# Patient Record
Sex: Female | Born: 1945 | Race: White | Hispanic: No | Smoking: Former smoker
Health system: Southern US, Community
[De-identification: ages and names within clinical notes are randomized; demographics above are authoritative.]

## PROBLEM LIST (undated history)

## (undated) DIAGNOSIS — I341 Nonrheumatic mitral (valve) prolapse: Secondary | ICD-10-CM

## (undated) DIAGNOSIS — E079 Disorder of thyroid, unspecified: Secondary | ICD-10-CM

## (undated) DIAGNOSIS — H269 Unspecified cataract: Secondary | ICD-10-CM

## (undated) DIAGNOSIS — F32A Depression, unspecified: Secondary | ICD-10-CM

## (undated) DIAGNOSIS — J3800 Paralysis of vocal cords and larynx, unspecified: Secondary | ICD-10-CM

## (undated) DIAGNOSIS — K5790 Diverticulosis of intestine, part unspecified, without perforation or abscess without bleeding: Secondary | ICD-10-CM

## (undated) DIAGNOSIS — S82201A Unspecified fracture of shaft of right tibia, initial encounter for closed fracture: Secondary | ICD-10-CM

## (undated) DIAGNOSIS — R739 Hyperglycemia, unspecified: Secondary | ICD-10-CM

## (undated) DIAGNOSIS — I10 Essential (primary) hypertension: Secondary | ICD-10-CM

## (undated) DIAGNOSIS — H04129 Dry eye syndrome of unspecified lacrimal gland: Secondary | ICD-10-CM

## (undated) DIAGNOSIS — E7800 Pure hypercholesterolemia, unspecified: Secondary | ICD-10-CM

## (undated) DIAGNOSIS — K589 Irritable bowel syndrome without diarrhea: Secondary | ICD-10-CM

## (undated) DIAGNOSIS — F419 Anxiety disorder, unspecified: Secondary | ICD-10-CM

## (undated) DIAGNOSIS — R519 Headache, unspecified: Secondary | ICD-10-CM

## (undated) DIAGNOSIS — F329 Major depressive disorder, single episode, unspecified: Secondary | ICD-10-CM

## (undated) HISTORY — PX: OTHER SURGICAL HISTORY: SHX169

## (undated) HISTORY — DX: Unspecified cataract: H26.9

## (undated) HISTORY — DX: Paralysis of vocal cords and larynx, unspecified: J38.00

## (undated) HISTORY — PX: DILATION AND CURETTAGE OF UTERUS: SHX78

## (undated) HISTORY — DX: Disorder of thyroid, unspecified: E07.9

## (undated) HISTORY — DX: Dry eye syndrome of unspecified lacrimal gland: H04.129

## (undated) HISTORY — DX: Headache, unspecified: R51.9

## (undated) HISTORY — DX: Essential (primary) hypertension: I10

## (undated) HISTORY — DX: Hyperglycemia, unspecified: R73.9

## (undated) HISTORY — DX: Unspecified fracture of shaft of right tibia, initial encounter for closed fracture: S82.201A

## (undated) HISTORY — DX: Diverticulosis of intestine, part unspecified, without perforation or abscess without bleeding: K57.90

## (undated) HISTORY — DX: Pure hypercholesterolemia, unspecified: E78.00

## (undated) HISTORY — PX: CHOLECYSTECTOMY: SHX55

---

## 1967-10-26 HISTORY — PX: BUNIONECTOMY: SHX129

## 1987-10-26 HISTORY — PX: KNEE ARTHROSCOPY: SUR90

## 1998-11-26 ENCOUNTER — Encounter: Payer: Self-pay | Admitting: Specialist

## 1998-12-01 ENCOUNTER — Ambulatory Visit (HOSPITAL_COMMUNITY): Admission: RE | Admit: 1998-12-01 | Discharge: 1998-12-01 | Payer: Self-pay | Admitting: Specialist

## 1999-02-09 ENCOUNTER — Other Ambulatory Visit: Admission: RE | Admit: 1999-02-09 | Discharge: 1999-02-09 | Payer: Self-pay | Admitting: Obstetrics and Gynecology

## 1999-06-22 ENCOUNTER — Ambulatory Visit (HOSPITAL_COMMUNITY): Admission: RE | Admit: 1999-06-22 | Discharge: 1999-06-22 | Payer: Self-pay | Admitting: Gastroenterology

## 2000-04-22 ENCOUNTER — Other Ambulatory Visit: Admission: RE | Admit: 2000-04-22 | Discharge: 2000-04-22 | Payer: Self-pay | Admitting: Obstetrics and Gynecology

## 2001-06-02 ENCOUNTER — Other Ambulatory Visit: Admission: RE | Admit: 2001-06-02 | Discharge: 2001-06-02 | Payer: Self-pay | Admitting: Obstetrics and Gynecology

## 2002-06-15 ENCOUNTER — Other Ambulatory Visit: Admission: RE | Admit: 2002-06-15 | Discharge: 2002-06-15 | Payer: Self-pay | Admitting: Obstetrics and Gynecology

## 2002-07-24 ENCOUNTER — Emergency Department (HOSPITAL_COMMUNITY): Admission: EM | Admit: 2002-07-24 | Discharge: 2002-07-25 | Payer: Self-pay | Admitting: Emergency Medicine

## 2002-07-25 ENCOUNTER — Encounter: Payer: Self-pay | Admitting: *Deleted

## 2003-10-04 ENCOUNTER — Other Ambulatory Visit: Admission: RE | Admit: 2003-10-04 | Discharge: 2003-10-04 | Payer: Self-pay | Admitting: Obstetrics and Gynecology

## 2004-12-25 ENCOUNTER — Other Ambulatory Visit: Admission: RE | Admit: 2004-12-25 | Discharge: 2004-12-25 | Payer: Self-pay | Admitting: Obstetrics and Gynecology

## 2005-03-29 ENCOUNTER — Ambulatory Visit: Admission: RE | Admit: 2005-03-29 | Discharge: 2005-03-29 | Payer: Self-pay | Admitting: Gastroenterology

## 2005-03-29 ENCOUNTER — Encounter (INDEPENDENT_AMBULATORY_CARE_PROVIDER_SITE_OTHER): Payer: Self-pay | Admitting: *Deleted

## 2005-10-16 ENCOUNTER — Emergency Department (HOSPITAL_COMMUNITY): Admission: EM | Admit: 2005-10-16 | Discharge: 2005-10-17 | Payer: Self-pay | Admitting: Emergency Medicine

## 2006-02-18 ENCOUNTER — Other Ambulatory Visit: Admission: RE | Admit: 2006-02-18 | Discharge: 2006-02-18 | Payer: Self-pay | Admitting: Obstetrics and Gynecology

## 2007-03-03 ENCOUNTER — Other Ambulatory Visit: Admission: RE | Admit: 2007-03-03 | Discharge: 2007-03-03 | Payer: Self-pay | Admitting: Obstetrics and Gynecology

## 2007-04-04 ENCOUNTER — Ambulatory Visit (HOSPITAL_COMMUNITY): Admission: RE | Admit: 2007-04-04 | Discharge: 2007-04-04 | Payer: Self-pay | Admitting: Gastroenterology

## 2007-09-28 ENCOUNTER — Encounter: Payer: Self-pay | Admitting: Internal Medicine

## 2007-10-06 ENCOUNTER — Ambulatory Visit: Payer: Self-pay | Admitting: Internal Medicine

## 2007-10-06 DIAGNOSIS — E785 Hyperlipidemia, unspecified: Secondary | ICD-10-CM | POA: Insufficient documentation

## 2007-10-06 DIAGNOSIS — I10 Essential (primary) hypertension: Secondary | ICD-10-CM | POA: Insufficient documentation

## 2007-10-06 DIAGNOSIS — K219 Gastro-esophageal reflux disease without esophagitis: Secondary | ICD-10-CM

## 2007-10-06 LAB — CONVERTED CEMR LAB: Hgb A1c MFr Bld: 5.8 % (ref 4.6–6.0)

## 2007-10-07 ENCOUNTER — Encounter (INDEPENDENT_AMBULATORY_CARE_PROVIDER_SITE_OTHER): Payer: Self-pay | Admitting: *Deleted

## 2007-11-10 ENCOUNTER — Encounter: Payer: Self-pay | Admitting: Internal Medicine

## 2007-12-21 ENCOUNTER — Encounter: Payer: Self-pay | Admitting: Internal Medicine

## 2008-01-01 ENCOUNTER — Encounter (INDEPENDENT_AMBULATORY_CARE_PROVIDER_SITE_OTHER): Payer: Self-pay | Admitting: *Deleted

## 2008-01-01 ENCOUNTER — Ambulatory Visit: Payer: Self-pay | Admitting: Internal Medicine

## 2008-03-08 ENCOUNTER — Other Ambulatory Visit: Admission: RE | Admit: 2008-03-08 | Discharge: 2008-03-08 | Payer: Self-pay | Admitting: Obstetrics and Gynecology

## 2008-05-30 ENCOUNTER — Encounter: Payer: Self-pay | Admitting: Internal Medicine

## 2008-07-11 ENCOUNTER — Encounter: Payer: Self-pay | Admitting: Internal Medicine

## 2008-08-16 ENCOUNTER — Encounter: Payer: Self-pay | Admitting: Internal Medicine

## 2008-12-06 ENCOUNTER — Encounter: Payer: Self-pay | Admitting: Internal Medicine

## 2008-12-12 ENCOUNTER — Encounter: Payer: Self-pay | Admitting: Internal Medicine

## 2009-03-25 HISTORY — PX: HYSTEROSCOPY: SHX211

## 2009-04-03 ENCOUNTER — Encounter: Payer: Self-pay | Admitting: Internal Medicine

## 2009-04-04 ENCOUNTER — Ambulatory Visit (HOSPITAL_COMMUNITY): Admission: RE | Admit: 2009-04-04 | Discharge: 2009-04-04 | Payer: Self-pay | Admitting: Obstetrics & Gynecology

## 2009-04-04 ENCOUNTER — Encounter: Payer: Self-pay | Admitting: Obstetrics & Gynecology

## 2009-05-16 ENCOUNTER — Encounter (INDEPENDENT_AMBULATORY_CARE_PROVIDER_SITE_OTHER): Payer: Self-pay | Admitting: *Deleted

## 2009-08-22 ENCOUNTER — Encounter: Payer: Self-pay | Admitting: Internal Medicine

## 2009-09-26 ENCOUNTER — Ambulatory Visit: Payer: Self-pay | Admitting: Internal Medicine

## 2009-09-26 DIAGNOSIS — R7309 Other abnormal glucose: Secondary | ICD-10-CM

## 2009-09-26 DIAGNOSIS — R6889 Other general symptoms and signs: Secondary | ICD-10-CM | POA: Insufficient documentation

## 2009-09-26 DIAGNOSIS — E559 Vitamin D deficiency, unspecified: Secondary | ICD-10-CM | POA: Insufficient documentation

## 2009-09-28 DIAGNOSIS — E039 Hypothyroidism, unspecified: Secondary | ICD-10-CM

## 2009-09-28 DIAGNOSIS — F411 Generalized anxiety disorder: Secondary | ICD-10-CM | POA: Insufficient documentation

## 2009-10-09 ENCOUNTER — Encounter: Payer: Self-pay | Admitting: Internal Medicine

## 2009-10-10 ENCOUNTER — Ambulatory Visit: Payer: Self-pay | Admitting: Internal Medicine

## 2009-10-12 LAB — CONVERTED CEMR LAB
Calcium: 8.9 mg/dL (ref 8.4–10.5)
Hgb A1c MFr Bld: 6.2 % (ref 4.6–6.5)
Magnesium: 2.1 mg/dL (ref 1.5–2.5)
Vit D, 25-Hydroxy: 21 ng/mL — ABNORMAL LOW (ref 30–89)

## 2009-10-13 ENCOUNTER — Encounter (INDEPENDENT_AMBULATORY_CARE_PROVIDER_SITE_OTHER): Payer: Self-pay | Admitting: *Deleted

## 2009-10-15 ENCOUNTER — Ambulatory Visit: Payer: Self-pay | Admitting: Family

## 2009-10-15 DIAGNOSIS — J040 Acute laryngitis: Secondary | ICD-10-CM | POA: Insufficient documentation

## 2009-11-10 ENCOUNTER — Encounter: Payer: Self-pay | Admitting: Internal Medicine

## 2009-11-21 ENCOUNTER — Ambulatory Visit: Payer: Self-pay | Admitting: Internal Medicine

## 2009-11-21 DIAGNOSIS — IMO0001 Reserved for inherently not codable concepts without codable children: Secondary | ICD-10-CM | POA: Insufficient documentation

## 2009-12-08 ENCOUNTER — Encounter: Payer: Self-pay | Admitting: Internal Medicine

## 2010-05-08 ENCOUNTER — Encounter: Payer: Self-pay | Admitting: Internal Medicine

## 2010-05-11 ENCOUNTER — Encounter: Payer: Self-pay | Admitting: Internal Medicine

## 2010-05-15 ENCOUNTER — Ambulatory Visit: Payer: Self-pay | Admitting: Internal Medicine

## 2010-09-11 ENCOUNTER — Ambulatory Visit: Payer: Self-pay | Admitting: Cardiology

## 2010-09-11 ENCOUNTER — Encounter: Payer: Self-pay | Admitting: Internal Medicine

## 2010-09-11 LAB — LIPID PANEL
Cholesterol: 193 mg/dL (ref 0–200)
Triglycerides: 98 mg/dL (ref 40–160)

## 2010-11-06 ENCOUNTER — Ambulatory Visit
Admission: RE | Admit: 2010-11-06 | Discharge: 2010-11-06 | Payer: Self-pay | Source: Home / Self Care | Attending: Internal Medicine | Admitting: Internal Medicine

## 2010-11-26 NOTE — Assessment & Plan Note (Signed)
Summary: f/u & BP Check - jr   Vital Signs:  Patient profile:   65 year old female Weight:      146.4 pounds Pulse rate:   76 / minute Resp:     15 per minute BP sitting:   140 / 92  (left arm) Cuff size:   large  Vitals Entered By: Shonna Chock (November 21, 2009 3:19 PM) CC: F/U on b/p and ongoing Neck Tightness Comments REVIEWED MED LIST, PATIENT AGREED DOSE AND INSTRUCTION CORRECT    CC:  F/U on b/p and ongoing Neck Tightness.  History of Present Illness: BP control is excellent with Diovan HCT 160/12.5; BP too low with this & Amlodipine  @ even 5 mg once daily.She describes "tightness " @ base of neck bilaterally ; no angioedema.She has no change in neck symptoms with ARB/ HCT. Neck symptoms daily since late October, better overnight, present on weekends also. "Tightness " in neck muscles is  more worrisome than  sensation of throat closing off.No dysphagia with foods or pills.No PMH of asthma. Symptoms no better with H2 blocker qhs  & PPI two times a day since 10/09/2009 as per Dr Madilyn Hook ,ENT , [facility] [place] [facility].  Allergies: 1)  ! Morphine 2)  ! Codeine 3)  ! * Statins (Most) Except Crestor  Review of Systems Eyes:  Denies double vision. ENT:  Denies difficulty swallowing. Resp:  Complains of cough; denies shortness of breath, sputum productive, and wheezing. GI:  Denies indigestion. Neuro:  Denies brief paralysis, numbness, tingling, and weakness.  Physical Exam  General:  in no acute distress; alert,appropriate and cooperative throughout examination Eyes:  No corneal or conjunctival inflammation noted. EOMI. Perrla. Field of  Vision grossly normal. Ears:  External ear exam shows no significant lesions or deformities.  Otoscopic examination reveals clear canals, tympanic membranes are intact bilaterally without bulging, retraction, inflammation or discharge. Hearing is grossly normal bilaterally. Nose:  External nasal examination shows no deformity or  inflammation. Nasal mucosa are pink and moist without lesions or exudates. Mouth:  Oral mucosa and oropharynx without lesions or exudates.  Teeth in good repair. Neck:  No deformities, masses, or tenderness noted. No UAO with hyperventilation. Full ROM Neurologic:  alert & oriented X3, cranial nerves II-XII intact, strength normal in all extremities, sensation intact to light touch, and DTRs symmetrical and normal.   Skin:  Intact without suspicious lesions or rashes Cervical Nodes:  No lymphadenopathy noted Axillary Nodes:  No palpable lymphadenopathy   Impression & Recommendations:  Problem # 1:  MUSCLE PAIN (ICD-729.1)  neck muscle tightness & associated  sensation of throat closure; R/O dystonia  variant Her updated medication list for this problem includes:    Aspirin 81 Mg Tabs (Aspirin) .Marland Kitchen... 1 by mouth once daily  Complete Medication List: 1)  Alprazolam 1 Mg Tabs (Alprazolam) .Marland Kitchen.. 1-2 at bedtime 2)  Bupropion Hcl 300 Mg Xr24h-tab (Bupropion hcl) .Marland Kitchen.. 1 by mouth once daily 3)  Klor-con M20 20 Meq Cr-tabs (Potassium chloride crys cr) .Marland Kitchen.. 1 by mouth once daily 4)  Miralax Powd (Polyethylene glycol 3350) .Marland Kitchen.. 1-17gm at bedtime 5)  Nulev 0.125 Mg Tbdp (Hyoscyamine sulfate) .Marland Kitchen.. 1 by mouth as needed 6)  Provigil 200 Mg Tabs (Modafinil) .Marland Kitchen.. 1 by mouth once daily 7)  Synthroid 75 Mcg Tabs (Levothyroxine sodium) .Marland Kitchen.. 1 by mouth once daily 8)  Zetia 10 Mg Tabs (Ezetimibe) .Marland Kitchen.. 1 by mouth once daily 9)  Aspirin 81 Mg Tabs (Aspirin) .Marland KitchenMarland KitchenMarland Kitchen  1 by mouth once daily 10)  Crestor 5 Mg Tabs (Rosuvastatin calcium) .Marland Kitchen.. 1 by mouth at bedtime 11)  Glycopyrrolate 2 Mg Tabs (Glycopyrrolate) .... 1/2-1 by mouth once daily 12)  Lamotrigine 100 Mg Tabs (Lamotrigine) .Marland Kitchen.. 1 by mouth am, 1 by mouth at bedtime 13)  Nexium 40 Mg Cpdr (Esomeprazole magnesium) .... Take one tablet two times a day 14)  Prometrium 100 Mg Caps (Progesterone micronized) .Marland Kitchen.. 1 by mouth at bedtime 15)  Ranitidine Hcl 300 Mg Caps  (Ranitidine hcl) .... Take one tablet daily 16)  Vivelle-dot 0.05 Mg/24hr Pttw (Estradiol) .Marland Kitchen.. 1 by mouth twice weekly 17)  Diovan Hct 160-12.5 Mg Tabs (Valsartan-hydrochlorothiazide) .Marland Kitchen.. 1 by mouth once daily  Patient Instructions: 1)  Please see Dr Madilyn Hook to F/U. Correct or add to this record prior to that appt as needed

## 2010-11-26 NOTE — Letter (Signed)
Summary: [facility]  [facility]   Imported By: Lanelle Bal 12/17/2009 09:42:16  _____________________________________________________________________  External Attachment:    Type:   Image     Comment:   External Document

## 2010-11-26 NOTE — Letter (Signed)
Summary: Email from Patient Regarding BP & Meds  Email from Patient Regarding BP & Meds   Imported By: Lanelle Bal 11/17/2009 09:36:48  _____________________________________________________________________  External Attachment:    Type:   Image     Comment:   External Document

## 2010-11-26 NOTE — Letter (Signed)
Summary: [facility]  [facility]   Imported By: Lanelle Bal 11/11/2009 09:45:23  _____________________________________________________________________  External Attachment:    Type:   Image     Comment:   External Document

## 2010-11-26 NOTE — Assessment & Plan Note (Signed)
Summary: f/u blood sugar/cbs   Vital Signs:  Patient profile:   65 year old female Weight:      138.4 pounds BMI:     25.61 Pulse rate:   72 / minute Resp:     13 per minute BP sitting:   132 / 84  (left arm) Cuff size:   large  Vitals Entered By: Shonna Chock CMA (November 06, 2010 8:07 AM) CC: Follow-up visit: discuss bloodsugars, Type 2 diabetes mellitus follow-up   CC:  Follow-up visit: discuss bloodsugars and Type 2 diabetes mellitus follow-up.  History of Present Illness:      This is a 65 year old woman who presents for  fasting hyperglycemia  follow-up.  The patient reports weight loss of 10#, but denies polyuria, polydipsia, blurred vision, self managed hypoglycemia, and numbness of extremities.  The patient denies the following symptoms: neuropathic pain, chest pain, vomiting, orthostatic symptoms, poor wound healing, vision loss, and foot ulcer.  Since the last visit the patient reports good dietary compliance and not monitoring blood glucose.  Kaysa  reports having had eye care by an ophthalmologist;no retinopathy.A1c 6.2%(132/24% risk); LDL is @ goal (99).     Current Medications (verified): 1)  Alprazolam 1 Mg Tabs (Alprazolam) .Marland Kitchen.. 1-2 At Bedtime 2)  Bupropion Hcl 300 Mg Xr24h-Tab (Bupropion Hcl) .Marland Kitchen.. 1 By Mouth Once Daily 3)  Miralax  Powd (Polyethylene Glycol 3350) .Marland Kitchen.. 1-17gm At Bedtime 4)  Synthroid 75 Mcg Tabs (Levothyroxine Sodium) .Marland Kitchen.. 1 By Mouth Once Daily 5)  Zetia 10 Mg Tabs (Ezetimibe) .Marland Kitchen.. 1 By Mouth Once Daily 6)  Aspirin 81 Mg Tabs (Aspirin) .Marland Kitchen.. 1 By Mouth Once Daily 7)  Crestor 5 Mg Tabs (Rosuvastatin Calcium) .Marland Kitchen.. 1 By Mouth At Bedtime 8)  Glycopyrrolate 2 Mg Tabs (Glycopyrrolate) .... 1/2-1 By Mouth Once Daily 9)  Lamotrigine 100 Mg Tabs (Lamotrigine) .Marland Kitchen.. 1 By Mouth Am, 1 By Mouth At Bedtime 10)  Nexium 40 Mg Cpdr (Esomeprazole Magnesium) .Marland Kitchen.. 1 By Mouth Once Daily 11)  Ranitidine Hcl 300 Mg Caps (Ranitidine Hcl) .... Take One Tablet Daily 12)   Diovan Hct 160-12.5 Mg Tabs (Valsartan-Hydrochlorothiazide) .Marland Kitchen.. 1 By Mouth Once Daily  Allergies: 1)  ! Morphine 2)  ! Codeine 3)  ! * Statins (Most) Except Crestor  Physical Exam  General:  well-nourished;alert,appropriate and cooperative throughout examination Lungs:  Normal respiratory effort, chest expands symmetrically. Lungs are clear to auscultation, no crackles or wheezes. Heart:  Normal rate and regular rhythm. S1 and S2 normal without gallop, murmur, click, rub or other extra sounds. Pulses:  R and L carotid,radial,dorsalis pedis and posterior tibial pulses are full and equal bilaterally Extremities:  No clubbing, cyanosis, edema. 2nd R toe overlaps great toe Neurologic:  alert & oriented X3 and sensation intact to light touch over feet.   Skin:  Intact without suspicious lesions or rashes Psych:  Focused & intelligent   Impression & Recommendations:  Problem # 1:  HYPERGLYCEMIA, FASTING (ICD-790.29)  Complete Medication List: 1)  Alprazolam 1 Mg Tabs (Alprazolam) .Marland Kitchen.. 1-2 at bedtime 2)  Bupropion Hcl 300 Mg Xr24h-tab (Bupropion hcl) .Marland Kitchen.. 1 by mouth once daily 3)  Miralax Powd (Polyethylene glycol 3350) .Marland Kitchen.. 1-17gm at bedtime 4)  Synthroid 75 Mcg Tabs (Levothyroxine sodium) .Marland Kitchen.. 1 by mouth once daily 5)  Zetia 10 Mg Tabs (Ezetimibe) .Marland Kitchen.. 1 by mouth once daily 6)  Aspirin 81 Mg Tabs (Aspirin) .Marland Kitchen.. 1 by mouth once daily 7)  Crestor 5 Mg Tabs (Rosuvastatin calcium) .Marland Kitchen.. 1 by  mouth at bedtime 8)  Glycopyrrolate 2 Mg Tabs (Glycopyrrolate) .... 1/2-1 by mouth once daily 9)  Lamotrigine 100 Mg Tabs (Lamotrigine) .Marland Kitchen.. 1 by mouth am, 1 by mouth at bedtime 10)  Nexium 40 Mg Cpdr (Esomeprazole magnesium) .Marland Kitchen.. 1 by mouth once daily 11)  Ranitidine Hcl 300 Mg Caps (Ranitidine hcl) .... Take one tablet daily 12)  Diovan Hct 160-12.5 Mg Tabs (Valsartan-hydrochlorothiazide) .Marland Kitchen.. 1 by mouth once daily  Patient Instructions: 1)  Avoid HFCS sugar as discussed. Consider low carb diet.    2)  Please  have  a follow-up   A1c  in 6 months. 3)  Check your feet each night for sore areas, calluses or signs of infection. Consider seeing Dr Lestine Box. 4)  It is important that you exercise regularly at least 20 minutes 5 times a week. If you develop chest pain, have severe difficulty breathing, or feel very tired , stop exercising immediately and seek medical attention.   Orders Added: 1)  Est. Patient Level III [[address]]

## 2010-11-26 NOTE — Medication Information (Signed)
Summary: Possible Nonadherence with Amlodipine Besylate/[facility]  Possible Nonadherence with Amlodipine Besylate/[facility]   Imported By: Lanelle Bal 12/13/2009 10:32:09  _____________________________________________________________________  External Attachment:    Type:   Image     Comment:   External Document

## 2010-11-26 NOTE — Assessment & Plan Note (Signed)
Summary: follow-up//lch   Vital Signs:  Patient profile:   65 year old female Weight:      143.6 pounds Pulse rate:   76 / minute Resp:     15 per minute BP sitting:   134 / 80  (left arm) Cuff size:   large  Vitals Entered By: Shonna Chock CMA (May 15, 2010 9:11 AM) CC: DM Follow-up, Type 2 diabetes mellitus follow-up   CC:  DM Follow-up and Type 2 diabetes mellitus follow-up.  History of Present Illness:  Type 2 Diabetes Mellitus Risk Assessment      This is a 65 year old woman who presents for Type 2 diabetes mellitus  risk monitor . FBS @ Dr Yevonne Pax office 124; A1c 6% 05/11/2010.  The patient reports self managed hypoglycemia  approx 1X/month in afternoon when lunch skipped, but denies polyuria, polydipsia, blurred vision, weight loss, weight gain, and numbness of extremities.  The patient denies the following symptoms: neuropathic pain, chest pain, vomiting, orthostatic symptoms, poor wound healing, intermittent claudication, vision loss, and foot ulcer.  Since the last visit the patient reports poor dietary compliance ( eating carbs when stressed), not exercising regularly. No glucose  monitoring . Eye care by  Dr Burgess Estelle, Ophthalmologist  this afternoon.No foot care to date.  No FH DM.  Dr Tomi Bamberger labs reviewed ; all others @ goal.(scanned)  Current Medications (verified): 1)  Alprazolam 1 Mg Tabs (Alprazolam) .Marland Kitchen.. 1-2 At Bedtime 2)  Bupropion Hcl 300 Mg Xr24h-Tab (Bupropion Hcl) .Marland Kitchen.. 1 By Mouth Once Daily 3)  Klor-Con M20 20 Meq Cr-Tabs (Potassium Chloride Crys Cr) .Marland Kitchen.. 1 By Mouth Once Daily 4)  Miralax  Powd (Polyethylene Glycol 3350) .Marland Kitchen.. 1-17gm At Bedtime 5)  Nulev 0.125 Mg Tbdp (Hyoscyamine Sulfate) .Marland Kitchen.. 1 By Mouth As Needed 6)  Provigil 200 Mg Tabs (Modafinil) .Marland Kitchen.. 1 By Mouth Once Daily 7)  Synthroid 75 Mcg Tabs (Levothyroxine Sodium) .Marland Kitchen.. 1 By Mouth Once Daily 8)  Zetia 10 Mg Tabs (Ezetimibe) .Marland Kitchen.. 1 By Mouth Once Daily 9)  Aspirin 81 Mg Tabs (Aspirin) .Marland Kitchen.. 1 By  Mouth Once Daily 10)  Crestor 5 Mg Tabs (Rosuvastatin Calcium) .Marland Kitchen.. 1 By Mouth At Bedtime 11)  Glycopyrrolate 2 Mg Tabs (Glycopyrrolate) .... 1/2-1 By Mouth Once Daily 12)  Lamotrigine 100 Mg Tabs (Lamotrigine) .Marland Kitchen.. 1 By Mouth Am, 1 By Mouth At Bedtime 13)  Nexium 40 Mg Cpdr (Esomeprazole Magnesium) .Marland Kitchen.. 1 By Mouth Once Daily 14)  Prometrium 100 Mg Caps (Progesterone Micronized) .Marland Kitchen.. 1 By Mouth At Bedtime 15)  Ranitidine Hcl 300 Mg Caps (Ranitidine Hcl) .... Take One Tablet Daily 16)  Vivelle-Dot 0.05 Mg/24hr Pttw (Estradiol) .Marland Kitchen.. 1 By Mouth Twice Weekly 17)  Diovan Hct 160-12.5 Mg Tabs (Valsartan-Hydrochlorothiazide) .Marland Kitchen.. 1 By Mouth Once Daily  Allergies: 1)  ! Morphine 2)  ! Codeine 3)  ! * Statins (Most) Except Crestor  Physical Exam  General:  well-nourished; alert,appropriate and cooperative throughout examination Neck:  No deformities, masses, or tenderness noted. Lungs:  Normal respiratory effort, chest expands symmetrically. Lungs are clear to auscultation, no crackles or wheezes. Heart:  Normal rate and regular rhythm. S1 and S2 normal without gallop, murmur, click, rub . S4 with slight slurring Abdomen:  Bowel sounds positive,abdomen soft and non-tender without masses, organomegaly or hernias noted. Pulses:  R and L carotid,radial,dorsalis pedis and posterior tibial pulses are full and equal bilaterally Extremities:  No clubbing, cyanosis, edema. 2nd R toe overlaps  great toe.  Deformed L great toe nail Neurologic:  alert & oriented X3 and sensation intact to light touch over feet.   Skin:  Intact without suspicious lesions or rashes Psych:  memory intact for recent and remote, normally interactive, and good eye contact.     Impression & Recommendations:  Problem # 1:  HYPERGLYCEMIA, FASTING (ICD-790.29)  Problem # 2:  HYPOTHYROIDISM (ICD-244.9) TSH therapeutic Her updated medication list for this problem includes:    Synthroid 75 Mcg Tabs (Levothyroxine sodium) .Marland Kitchen... 1  by mouth once daily  Complete Medication List: 1)  Alprazolam 1 Mg Tabs (Alprazolam) .Marland Kitchen.. 1-2 at bedtime 2)  Bupropion Hcl 300 Mg Xr24h-tab (Bupropion hcl) .Marland Kitchen.. 1 by mouth once daily 3)  Klor-con M20 20 Meq Cr-tabs (Potassium chloride crys cr) .Marland Kitchen.. 1 by mouth once daily 4)  Miralax Powd (Polyethylene glycol 3350) .Marland Kitchen.. 1-17gm at bedtime 5)  Nulev 0.125 Mg Tbdp (Hyoscyamine sulfate) .Marland Kitchen.. 1 by mouth as needed 6)  Provigil 200 Mg Tabs (Modafinil) .Marland Kitchen.. 1 by mouth once daily 7)  Synthroid 75 Mcg Tabs (Levothyroxine sodium) .Marland Kitchen.. 1 by mouth once daily 8)  Zetia 10 Mg Tabs (Ezetimibe) .Marland Kitchen.. 1 by mouth once daily 9)  Aspirin 81 Mg Tabs (Aspirin) .Marland Kitchen.. 1 by mouth once daily 10)  Crestor 5 Mg Tabs (Rosuvastatin calcium) .Marland Kitchen.. 1 by mouth at bedtime 11)  Glycopyrrolate 2 Mg Tabs (Glycopyrrolate) .... 1/2-1 by mouth once daily 12)  Lamotrigine 100 Mg Tabs (Lamotrigine) .Marland Kitchen.. 1 by mouth am, 1 by mouth at bedtime 13)  Nexium 40 Mg Cpdr (Esomeprazole magnesium) .Marland Kitchen.. 1 by mouth once daily 14)  Prometrium 100 Mg Caps (Progesterone micronized) .Marland Kitchen.. 1 by mouth at bedtime 15)  Ranitidine Hcl 300 Mg Caps (Ranitidine hcl) .... Take one tablet daily 16)  Vivelle-dot 0.05 Mg/24hr Pttw (Estradiol) .Marland Kitchen.. 1 by mouth twice weekly 17)  Diovan Hct 160-12.5 Mg Tabs (Valsartan-hydrochlorothiazide) .Marland Kitchen.. 1 by mouth once daily  Patient Instructions: 1)  Consume <  30 grams of sugar/ day from foods & drinks with High Fructose Corn Syrup  as #1, 2 or # 3 on label.Please  have  an A1c in 6 months. (Code : 790.29).Do not  skip lunch.It is important that you exercise regularly at least 20 minutes 5 times a week. If you develop chest pain, have severe difficulty breathing, or feel very tired , stop exercising immediately and seek medical attention. 2)  Take an  81 mg coated Aspirin every day.Check your feet each night for sore areas, calluses or signs of infection. Call if you change your mind about seeing a Podiatrist.

## 2010-11-27 ENCOUNTER — Encounter: Payer: Self-pay | Admitting: Cardiology

## 2010-11-27 DIAGNOSIS — R739 Hyperglycemia, unspecified: Secondary | ICD-10-CM | POA: Insufficient documentation

## 2010-11-27 DIAGNOSIS — E7800 Pure hypercholesterolemia, unspecified: Secondary | ICD-10-CM | POA: Insufficient documentation

## 2010-11-27 DIAGNOSIS — I10 Essential (primary) hypertension: Secondary | ICD-10-CM | POA: Insufficient documentation

## 2011-01-15 ENCOUNTER — Ambulatory Visit: Payer: Self-pay | Admitting: Cardiology

## 2011-02-01 LAB — CBC
HCT: 41.6 % (ref 36.0–46.0)
MCV: 95.6 fL (ref 78.0–100.0)
Platelets: 227 10*3/uL (ref 150–400)
RDW: 13 % (ref 11.5–15.5)
WBC: 6.3 10*3/uL (ref 4.0–10.5)

## 2011-02-01 LAB — URINALYSIS, MICROSCOPIC ONLY
Glucose, UA: NEGATIVE mg/dL
Ketones, ur: NEGATIVE mg/dL
Leukocytes, UA: NEGATIVE
Protein, ur: NEGATIVE mg/dL

## 2011-02-01 LAB — BASIC METABOLIC PANEL
BUN: 8 mg/dL (ref 6–23)
Chloride: 100 mEq/L (ref 96–112)
Creatinine, Ser: 0.61 mg/dL (ref 0.4–1.2)

## 2011-02-15 ENCOUNTER — Other Ambulatory Visit: Payer: Self-pay | Admitting: *Deleted

## 2011-02-15 DIAGNOSIS — E7800 Pure hypercholesterolemia, unspecified: Secondary | ICD-10-CM

## 2011-02-26 ENCOUNTER — Ambulatory Visit (INDEPENDENT_AMBULATORY_CARE_PROVIDER_SITE_OTHER): Payer: Medicare Other | Admitting: Cardiology

## 2011-02-26 ENCOUNTER — Other Ambulatory Visit (INDEPENDENT_AMBULATORY_CARE_PROVIDER_SITE_OTHER): Payer: Medicare Other | Admitting: *Deleted

## 2011-02-26 ENCOUNTER — Encounter: Payer: Self-pay | Admitting: Cardiology

## 2011-02-26 DIAGNOSIS — R7309 Other abnormal glucose: Secondary | ICD-10-CM

## 2011-02-26 DIAGNOSIS — E785 Hyperlipidemia, unspecified: Secondary | ICD-10-CM

## 2011-02-26 DIAGNOSIS — F411 Generalized anxiety disorder: Secondary | ICD-10-CM

## 2011-02-26 DIAGNOSIS — E7800 Pure hypercholesterolemia, unspecified: Secondary | ICD-10-CM

## 2011-02-26 DIAGNOSIS — R739 Hyperglycemia, unspecified: Secondary | ICD-10-CM

## 2011-02-26 DIAGNOSIS — I10 Essential (primary) hypertension: Secondary | ICD-10-CM

## 2011-02-26 LAB — LIPID PANEL
HDL: 63.8 mg/dL (ref >39.00)
LDL Cholesterol: 90 mg/dL (ref 0–99)
Total CHOL/HDL Ratio: 3
VLDL: 13 mg/dL (ref 0.0–40.0)

## 2011-02-26 LAB — HEPATIC FUNCTION PANEL
Alkaline Phosphatase: 54 U/L (ref 39–117)
Bilirubin, Direct: 0.1 mg/dL (ref 0.0–0.3)
Total Bilirubin: 0.3 mg/dL (ref 0.3–1.2)

## 2011-02-26 LAB — BASIC METABOLIC PANEL
CO2: 30 mEq/L (ref 19–32)
Calcium: 9.4 mg/dL (ref 8.4–10.5)
GFR: 87.61 mL/min (ref >60.00)
Sodium: 140 mEq/L (ref 135–145)

## 2011-02-26 NOTE — Assessment & Plan Note (Signed)
The patient has had a history of hyperglycemia.  At her last visit her blood sugar was higher in her hemoglobin A1c had risen to 6.3 in November 2011 and we had suggested that she start on some metformin.  However, the patient declined to go on metformin.  Instead she has been able to lose 6 pounds and be more careful with her diet.

## 2011-02-26 NOTE — Assessment & Plan Note (Signed)
Since last visit the patient has been feeling well.  His not expressing any chest pain.  She has been exercising less intensely.  Her husband has developed dementia and so they no longer go for long hikes and walks in the woods

## 2011-02-26 NOTE — Progress Notes (Signed)
Kaylee Waters Date of Birth:  1946/03/27 [facility] [address]   [address] [place], [place]  [address] [phone]           Fax   [phone]  HPI: This pleasant 65 year old Caucasian female is seen for a scheduledFour-month followup office visit.She has not been expressing any new cardiovascular symptoms since last visit.  She has been under more stress because of her husbands diagnosis of frontotemporal dementia.  This has prevented him from going on her usual long hikes that they used to enjoy together.  The patient does not have any history of known ischemic heart disease.  She had a normal stress Cardiolite on 07/11/08 with no evidence of ischemia and with ejection fraction of 73%.  Current Outpatient Prescriptions  Medication Sig Dispense Refill  . ALPRAZolam (XANAX) 1 MG tablet Take 1 mg by mouth at bedtime as needed. 1 -2 TABLETS HS       . aspirin 81 MG tablet Take 81 mg by mouth daily.        Marland Kitchen buPROPion (WELLBUTRIN XL) 300 MG 24 hr tablet Take 300 mg by mouth daily.        Marland Kitchen esomeprazole (NEXIUM) 40 MG capsule Take 40 mg by mouth daily.        Marland Kitchen estradiol (VIVELLE-DOT) 0.05 MG/24HR Place 0.25 patches onto the skin 2 (two) times daily.       Marland Kitchen ezetimibe (ZETIA) 10 MG tablet Take 10 mg by mouth daily.        . Glycopyrrolate (ROBINUL PO) Take by mouth. 1/2-1 TABLET DAILY       . Hyoscyamine Sulfate (NULEV) 0.125 MG TBDP Take by mouth as needed.        . lamoTRIgine (LAMICTAL) 100 MG tablet Take 100 mg by mouth at bedtime.        Marland Kitchen levothyroxine (SYNTHROID, LEVOTHROID) 75 MCG tablet Take 75 mcg by mouth daily. BRAND ONLY       . modafinil (PROVIGIL) 200 MG tablet Take 200 mg by mouth daily.        . Polyethylene Glycol 3350 (MIRALAX PO) Take by mouth as needed.        . potassium chloride SA (K-DUR,KLOR-CON) 20 MEQ tablet Take 20 mEq by mouth daily.        . progesterone (PROMETRIUM) 100 MG capsule Take 100 mg by mouth at bedtime.        .  ranitidine (ZANTAC) 150 MG capsule Take 150 mg by mouth 2 (two) times daily.        . rosuvastatin (CRESTOR) 5 MG tablet Take 5 mg by mouth daily.        . valsartan-hydrochlorothiazide (DIOVAN-HCT) 160-12.5 MG per tablet Take 1 tablet by mouth daily.          Allergies  Allergen Reactions  . Codeine     REACTION: GI side effects  . Lipitor (Atorvastatin Calcium) Other (See Comments)    MYALGIAS  . Mevacor (Lovastatin)     MYALGIAS  . Morphine     REACTION: ITCHING  . Norvasc (Amlodipine Besylate) Other (See Comments)    EDEMA  . Zocor (Simvastatin) Other (See Comments)    MYALGIAS    Patient Active Problem List  Diagnoses  . HYPOTHYROIDISM  . VITAMIN D DEFICIENCY  . Other and unspecified hyperlipidemia  . HYPERCALCEMIA  . ANXIETY  . Unspecified essential hypertension  . ACUTE LARYNGITIS, WITHOUT MENTION OF OBSTRUCTIO  . GASTROESOPHAGEAL REFLUX DISEASE, CHRONIC  .  MUSCLE PAIN  . OTHER SYMPTOMS INVOLVING HEAD AND NECK  . Other abnormal glucose  . HTN (hypertension)  . High cholesterol  . Hyperglycemia    History  Smoking status  . Former Smoker  Smokeless tobacco  . Not on file    History  Alcohol Use     No family history on file.  Review of Systems: The patient denies any heat or cold intolerance.  No weight gain or weight loss.  The patient denies headaches or blurry vision.  There is no cough or sputum production.  The patient denies dizziness.  There is no hematuria or hematochezia.  The patient denies any muscle aches or arthritis.  The patient denies any rash.  The patient denies frequent falling or instability.  There is no history of depression or anxiety.  All other systems were reviewed and are negative.   Physical Exam: Filed Vitals:   02/26/11 0839  BP: 124/80  Pulse: 72  The general appearance feels a well-developed well-nourished woman in no distress.Pupils equal and reactive.   Extraocular Movements are full.  There is no scleral icterus.   The mouth and pharynx are normal.  The neck is supple.  The carotids reveal no bruits.  The jugular venous pressure is normal.  The thyroid is not enlarged.  There is no lymphadenopathy.The chest is clear to percussion and auscultation. There are no rales or rhonchi. Expansion of the chest is symmetrical.The precordium is quiet.  The first heart sound is normal.  The second heart sound is physiologically split.  There is no murmur gallop rub or click.  There is no abnormal lift or heave.The abdomen is soft and nontender. Bowel sounds are normal. The liver and spleen are not enlarged. There Are no abdominal masses. There are no bruits.The pedal pulses are good.  There is no phlebitis or edema.  There is no cyanosis or clubbing.Strength is normal and symmetrical in all extremities.  There is no lateralizing weakness.  There are no sensory deficits.The skin is warm and dry.  There is no rash.    Assessment / Plan: Continue careful diet.  Blood work today pending.  Recheck in 4 months for followup office visit and lab work including glycohemoglobin

## 2011-02-26 NOTE — Assessment & Plan Note (Signed)
The patient has a long history of hypercholesterolemia.  She is on long-term Crestor which she is tolerating without side effects.

## 2011-02-26 NOTE — Assessment & Plan Note (Signed)
Patient has been under more stress.  Her husband was diagnosed with frontotemporal dementia.  The patient continues to work full-time.  She enjoys her work.

## 2011-03-02 ENCOUNTER — Telehealth: Payer: Self-pay | Admitting: *Deleted

## 2011-03-02 DIAGNOSIS — R7309 Other abnormal glucose: Secondary | ICD-10-CM

## 2011-03-02 NOTE — Telephone Encounter (Signed)
Labs reported 

## 2011-03-09 ENCOUNTER — Telehealth: Payer: Self-pay | Admitting: Cardiology

## 2011-03-09 NOTE — Op Note (Signed)
Kaylee Waters, Kaylee Waters              ACCOUNT NO.:  [id number]   MEDICAL RECORD NO.:  [id number]          PATIENT TYPE:  AMB   LOCATION:  ENDO                         FACILITY:  [facility]   PHYSICIAN:  Bernette Redbird, M.D.   DATE OF BIRTH:  [birth year]   DATE OF PROCEDURE:  04/04/2007  DATE OF DISCHARGE:                               OPERATIVE REPORT   PROCEDURE:  Upper endoscopy with Bravo capsule placement.   INDICATION:  65 year old female with probable LPR symptoms.   FINDINGS:  Small hiatal hernia but no evidence of esophagitis.   DESCRIPTION OF PROCEDURE:  The nature, purpose, and risks of the  procedure have been discussed with the patient who provided written  consent.  Sedation was fentanyl 100 mcg and Versed 9 mg IV; the patient  maintained sinus bradycardia during the procedure, and also had a couple  of transient episodes of desaturation, in one case going down very  briefly to about 82 or 85% saturation, with what appeared to be some  snoring or sleep apnea type behavior.  This responded promptly to  elevating the jaw, tilting the head back, turning up the oxygen, and  stimulating the patient.  She never looked cyanotic or appeared  diaphoretic or clinically unstable.   The Pentax video endoscope was passed under direct vision. The vocal  cords and larynx looked a little bit edematous, possibly compatible with  reflux laryngitis.  The esophagus was entered without undue difficulty  and had normal mucosa without evidence of reflux esophagitis, Barrett's  esophagus, free reflux, varices, infection, neoplasia or any ring or  stricture.  A 2 cm hiatal hernia was present.   The stomach contained a minimal clear residual and had normal mucosa  without evidence of gastritis, erosions, ulcers, polyps or masses.  A  retroflexed view of the cardia was unremarkable.  The pylorus, duodenal  bulb and second duodenum looked normal apart from a little bit of  prepyloric erythema.   The  Z-line was measured at 35 or 36 cm from the mouth, so the scope was  withdrawn and the Bravo device was marked with tape at 36 cm, passed  transorally into the esophagus (as confirmed by passing the scope into  the hypopharyngeal region to make sure it was truly in the esophagus and  not the trachea), and suction was applied for 45 seconds, the plunger  was depressed and turned and released, after which the suction was  disconnected and the Bravo introducer device was removed, successfully  deploying the capsule which was confirmed endoscopically to be attached  to the mucosa of the distal esophagus.   The patient tolerated the procedure well and there were no apparent  complications.   IMPRESSION.:  1. No endoscopic evidence of reflux esophagitis.  2. Successful deployment of Bravo capsule.  3. Possible sleep apnea based on desaturation with snoring in a      cyclical fashion during the exam as described above.   PLAN:  1. Await readings from the Bravo capsule which is being performed      while she has been off PPI therapy for  several days.  2. Await further input from the ENT division at [facility] where      she has already had her initial visit.           ______________________________  Bernette Redbird, M.D.     RB/MEDQ  D:  04/04/2007  T:  04/04/2007  Job:  [id number]   cc:   Lucky Cowboy, MD  Fax: [phone]   Cassell Clement, M.D.  Fax: [phone]

## 2011-03-09 NOTE — Op Note (Signed)
Kaylee Waters, Kaylee Waters              ACCOUNT NO.:  [id number]   MEDICAL RECORD NO.:  [id number]          PATIENT TYPE:  AMB   LOCATION:  SDC                           FACILITY:  [facility]   PHYSICIAN:  M. Leda Quail, MD  DATE OF BIRTH:  06-17-1946   DATE OF PROCEDURE:  04/04/2009  DATE OF DISCHARGE:                               OPERATIVE REPORT   PREOPERATIVE DIAGNOSES:  91. A 65 year old G50, P3 married white female with postmenopausal      bleeding.  2. Polyp noted on sonohysterogram.  3. Hypertension.  4. Hypothyroidism.  5. Hyperlipidemia.  6. Irritable bowel syndrome.  7. Mitral valve prolapse.  8. Depression/anxiety.   POSTOPERATIVE DIAGNOSES:  21. A 65 year old G16, P3 married white female with postmenopausal      bleeding.  2. Polyp noted on sonohysterogram.  3. Hypertension.  4. Hypothyroidism.  5. Hyperlipidemia.  6. Irritable bowel syndrome.  7. Mitral valve prolapse.  8. Depression/anxiety.   PROCEDURE:  Hysteroscopy, polyp resection using the resectoscope and  D&C.   SURGEON:  M. Leda Quail, MD   ASSISTANT:  RN staff.   ANESTHESIA:  MAC, Dr. Jean Rosenthal oversaw the case.   FINDINGS:  Polyp at the anterior fundus, otherwise thin endometrium.   SPECIMENS:  Polyp and curettings sent to pathology.   ESTIMATED BLOOD LOSS:  Minimal.   FLUIDS:  1000 mL of LR.   FLUID DEFICIT:  115 mL, 3% sorbitol.   URINE OUTPUT:  30 mL drained with in and out catheterization at the very  beginning of the procedure, this was concentrated   COMPLICATIONS:  None.   INDICATIONS:  Kaylee Waters is a very nice 65 year old G4, P3 married white  female who is well known to my practice she presented with history of  postmenopausal bleeding on February 11, 2009.  An endometrial biopsy was  performed that day which showed scant inactive endometrium.  There was  only small fragments of the endometrium that was present.  Therefore, I  recommended a sonohysterogram.  This was performed on Feb 26, 2009.  The  patient is on HRT.  Her uterus is 8.1 x 4.1 x 5.2 cm which is  retroverted and possible 6-mm intramural fibroid but there was a polyp  noted at the anterior fundus of the uterus.  Her endometrium was quite  thin with saline enhancement measuring 1.5 mm.  We discussed the risks  and benefits of removing this, was well documented in her chart.  She  also is aware that if she did not remove that, she may end up continuing  to spot bleed which is not bothersome to her.  She decided to go ahead  and proceed.  Surgery was scheduled.  She presented for the surgery  today.   PROCEDURE:  The patient was taken from the holding area to the operating  room with the running IV in place.  Informed consent present is on the  chart.  She is placed in supine position.  Anesthesia was administered  by the Anesthesia staff without difficulty.  Legs were positioned in the  dorsal lithotomy position  in the Drain stirrups.  Perineum, inner  thighs, and vagina are cleansed with a non Betadine-containing skin  prep.  She is allergic to oysters and we were not completely sure if she  had a full shellfish allergy or not.  The patient was then draped in the  normal sterile fashion.  A red rubber Foley catheter was used to drain  the bladder of all urine.  Legs were positioned in the high lithotomy  position at this point.  A bivalve speculum was placed in the vagina.  The cervix was visualized and grasped on the anterior lip with a single-  tooth tenaculum.  A paracervical block of 10 mL of 1% lidocaine mixed  1:1 with epinephrine (1:100,000 units).  A 2.5 mL were instilled at 3,  6, 9, and 12 o'clock positions respectively.  This cervix was then  dilated with Shawnie Pons dilators up to #21.  The cervix was sounded to 7.5  cm.  The diagnostic scope was then passed through cervical os, 3%  sorbitol was used as hysteroscopic fluid.  The tubal ostia were noted  and photo documentation was made.  The  endometrial cavity contained thin  endometrium and a polyp as noted on previous sonohysterogram.  Because  of its location and broad base, I felt resecting.  This would be better  than trying to grasp with the polyp forceps.  Therefore, the  resectoscope was obtained.  The cervix was dilated up to #33.  The  resectoscope was passed without difficulty.  The polyp was completely  removed in 4 passes of the resectoscope.  This ended the resection  portion of the procedure.  The scope was removed and the polyp pieces  were obtained and handed off.  Then, a #1 rough curette was used to  curette the endometrial cavity to rough gritty textures were noted in  all quadrants.  There was scant tissue and a small amount of mucus that  was obtained with this curetting.  This was all sent with the polyp for  pathologic analysis.  At this point, all instruments removed from the  cervix.  The tenaculum was removed as well.  Silver nitrate were used to  obtain hemostasis at the tenaculum sites.  There was scant bleeding and  the speculum was removed.  The skin prep was cleansed off the patient's  skin.  Her legs were positioned back in supine position.  She was  awakened from anesthesia without difficulty.   Sponge, lap, needle, and instrument counts were correct x2.  The patient  tolerated the procedure well, and she was taken at this time to the  recovery room in stable condition.      Lum Keas, MD  Electronically Signed     MSM/MEDQ  D:  04/04/2009  T:  04/04/2009  Job:  [id number]

## 2011-03-09 NOTE — Telephone Encounter (Signed)
Wants a copy of labs from 5/4

## 2011-03-09 NOTE — Telephone Encounter (Signed)
Mailed copy of labs 

## 2011-03-12 NOTE — Op Note (Signed)
Kaylee Waters, CERNEY              ACCOUNT NO.:  [id number]   MEDICAL RECORD NO.:  [id number]          PATIENT TYPE:  AMB   LOCATION:  DFTL                         FACILITY:  [facility]   PHYSICIAN:  Bernette Redbird, M.D.   DATE OF BIRTH:  May 12, 1946   DATE OF PROCEDURE:  03/29/2005  DATE OF DISCHARGE:                                 OPERATIVE REPORT   PROCEDURE:  Upper endoscopy with biopsies.   INDICATIONS:  A 65 year old female with nausea and 20 pound weight loss.   FINDINGS:  Essentially normal exam.   DESCRIPTION OF PROCEDURE:  The patient provided written consent for the  procedure. Sedation was fentanyl 100 mcg and Versed 8 mg IV without  arrhythmias or desaturation. The Olympus video endoscope was passed under  direct vision. The vocal cords were not well seen but the esophagus was  readily entered under direct vision and had normal mucosa without any  evidence of reflux esophagitis, Barrett's esophagus, varices, infection,  neoplasia or any ring stricture or hiatal hernia. The stomach contained no  residual. There was a little bit of bile coating the mid portion of the  stomach and some slight antral erythema in no sort of watermelon pattern,  without any intense erythema focal erosions or ulcers, vascular ectasia,  polyps or masses. A retroflexed view of the cardia was normal. The pylorus,  duodenal bulb and second duodenum looked normal. Several duodenal and antral  biopsies were obtained prior to removal of the scope. The patient tolerated  the procedure well and there were no apparent complications.   IMPRESSION:  Nausea, without obvious source on current examination (787.02).   Also, no evidence of gastric malignancy as a source of the patient's weight  loss.   PLAN:  Await pathology on biopsies with office follow-up as scheduled in the  near future.      RB/MEDQ  D:  03/29/2005  T:  03/29/2005  Job:  [id number]   cc:   Cassell Clement, M.D.  [address]  [address]  [place]  [place] [address]  Fax: [phone]

## 2011-03-12 NOTE — Op Note (Signed)
Kaylee Waters, Kaylee Waters              ACCOUNT NO.:  [id number]   MEDICAL RECORD NO.:  [id number]          PATIENT TYPE:  AMB   LOCATION:  ENDO                         [facility]ACILITY:  [facility]   PHYSICIAN:  Bernette Redbird, M.D.   DATE OF BIRTH:  22-Jun-1946   DATE OF PROCEDURE:  04/06/2007  DATE OF DISCHARGE:  04/04/2007                               OPERATIVE REPORT   PROCEDURE:  Ambulatory pH monitoring.   INDICATION:  This is a 65 year old female with probable symptoms related  to laryngopharyngeal reflux.   FINDINGS:  Mild reflux present, off PPI therapy.   PROCEDURE:  The procedure had been discussed with the patient, who  provided written consent.  Upper endoscopy was performed for placement  of the Bravo capsule approximately 5 cm above the Z-line.  Please see  separate dictated procedure report for the endoscopy portion of this  procedure.  Basically, the patient had a small hiatal hernia but no  endoscopic evidence of reflux esophagitis.  There was some questionable  mild reflux laryngitis to my evaluation.   BRAVO CAPSULE FINDINGS:  The DeMeester Score for day 1 was 16.9, and for  day 2, 29.3.  Normal is up to 14.72.  Thus, the total DeMeester Scores  suggest an abnormal amount of gastroesophageal reflux.   In viewing the tracing, the majority of the reflux occurred while the  patient was upright.  The longest duration of reflux was 16 minutes on  day 1, and 21 minutes on day 2.  The percent of time below pH of 4 was  5.8%, while upright 3.4% while supine on day 1, and 5.8% and 6.1%,  respectively, on day 2.  There did not appear to be a correlation with  any heartburn symptoms which were essentially absent during the 48-hour  monitoring period.   IMPRESSION:  Mild measured and confirmed reflux while off PPI therapy.   PLAN:  Resume PPI therapy, with office followup as scheduled  approximately a month from now.           ______________________________  Bernette Redbird,  M.D.     RB/MEDQ  D:  05/15/2007  T:  05/16/2007  Job:  [id number]   cc:   Lucky Cowboy, MD  Fax: [phone]   Cassell Clement, M.D.  Fax: [phone]   Kandee Keen, M.D.  Division of ENT  [facility]  [address]  [place], [place] [address]

## 2011-04-01 ENCOUNTER — Encounter: Payer: Self-pay | Admitting: Internal Medicine

## 2011-04-01 ENCOUNTER — Ambulatory Visit (INDEPENDENT_AMBULATORY_CARE_PROVIDER_SITE_OTHER): Payer: Medicare Other | Admitting: Internal Medicine

## 2011-04-01 VITALS — BP 122/80 | HR 78 | Wt 137.4 lb

## 2011-04-01 DIAGNOSIS — R7309 Other abnormal glucose: Secondary | ICD-10-CM

## 2011-04-01 LAB — MICROALBUMIN / CREATININE URINE RATIO: Microalb, Ur: 0.4 mg/dL (ref 0.0–1.9)

## 2011-04-01 LAB — HEMOGLOBIN A1C: Hgb A1c MFr Bld: 6.3 % (ref 4.6–6.5)

## 2011-04-01 NOTE — Patient Instructions (Addendum)
Diabetes Monitor   The A1c test checks the average amount of glucose (sugar) in the blood over the last 2 to 3 months.As glucose circulates in the blood, some of it binds to hemoglobin A. This is the main form of hemoglobin in adults. Hemoglobin is a red protein that carries oxygen in the red blood cells (RBC's). Once the glucose is bound to the hemoglobin A, it remains there for the life of the red blood cell (about 120 days). This combination of glucose and hemoglobin A is called A1c (or hemoglobin A1c or glycohemoglobin). Increased glucose in the blood, increases the hemoglobin A1c. A1c levels do not change quickly but will shift as older RBC's die and younger ones take their place.  The A1c test is used primarily to monitor the glucose control of diabetics over time. The goal of those with diabetes is to keep their blood glucose levels as close to normal as possible. This helps to minimize the complications caused by chronically elevated glucose levels, such as progressive damage to body organs like the kidneys, eyes, cardiovascular system, and nerves. The A1c test gives a picture of the average amount of glucose in the blood over the last few months. It can help a patient and his doctor know if the measures they are taking to control the patient's diabetes are successful or need to be adjusted.    Depending on the type of diabetes that you have, how well your diabetes is controlled, your A1c may be measured 2 to 4 times each year. When someone is first diagnosed with diabetes or if control is not good, A1c may be ordered more frequently.   NORMAL VALUES  Non diabetic adults: 5 %-6.1%  Good diabetic control: 6.2-6.4 %  Fair diabetic control: 6.5-7%  Poor diabetic control: greater than 7 % ( except with additional factors such as  advanced age; significant coronary or neurologic disease,etc). Check the A1c every 6 months if it is < 6.5%; every 4 months if  6.5% or higher.      Please pursue  Diabetic footwear especially when walking

## 2011-04-01 NOTE — Progress Notes (Signed)
Subjective:    Patient ID: Kaylee Waters, female    DOB: 03/15/1946, 65 y.o.   MRN: [id number]  HPI Diabetes status assessment: Fasting or morning glucose range:  Not checked   . Hypoglycemia :  no .                                                     Excess thirst :no;  Excess hunger:  no ;  Excess urination:  No but frequency.                                  Lightheadedness with standing:  no. Chest pain:  no ; Palpitations :no ;  Pain in  calves with walking:  no .                                                                                                                                Non healing skin  ulcers or sores,especially over the feet:  no. Numbness or tingling or burning in feet : no .                                                                                                                                              Significant change in  Weight : no. Vision changes : some blurring  .                                                                    Exercise : walking 2 mpd 3-4x/ week . Nutrition/diet:  Low carb. Medication compliance :  No DM meds. Medication adverse  Effects:  no . Eye exam : appt today. Foot care : no.  A1c/ urine microalbumin monitor:  6.3 % in 08/2010 ( 133, risk 26%), no microalbumin     Lipids from Dr [name] : HDL 64, LDL  90.  Review of Systems     Objective:   Physical Exam Gen.: Healthy and well-nourished in appearance. Alert, appropriate and cooperative throughout exam. Neck: No deformities, masses, or tenderness noted. Thyroid  normal. Lungs: Normal respiratory effort; chest expands symmetrically. Lungs are clear to auscultation without rales, wheezes, or increased work of breathing. Heart: Normal rate and rhythm. Normal S1 and S2. No gallop, click, or rub. No murmur. Nail health  good. Vascular: Carotid, radial artery, dorsalis pedis and dorsalis posterior tibial pulses are full and equal. No bruits present. Neurologic:  Alert and oriented x3. Deep tendon reflexes symmetrical and normal. Light touch normal over feet      Skin: Intact without suspicious lesions or rashes.  Hammer toe 2nd R crosses great toe Psych: Mood and affect are normal. Normally interactive                                                                                         Assessment & Plan:  #1 DM #2 hammer toe Plan: recommend Diabetes footwear if hammer not corrected

## 2011-05-06 ENCOUNTER — Encounter: Payer: Self-pay | Admitting: Cardiology

## 2011-07-05 ENCOUNTER — Ambulatory Visit: Payer: PRIVATE HEALTH INSURANCE | Admitting: Cardiology

## 2011-07-05 ENCOUNTER — Other Ambulatory Visit: Payer: PRIVATE HEALTH INSURANCE | Admitting: *Deleted

## 2011-07-09 ENCOUNTER — Ambulatory Visit: Payer: PRIVATE HEALTH INSURANCE | Admitting: Cardiology

## 2011-07-09 ENCOUNTER — Other Ambulatory Visit: Payer: PRIVATE HEALTH INSURANCE | Admitting: *Deleted

## 2011-08-04 ENCOUNTER — Encounter: Payer: Self-pay | Admitting: Internal Medicine

## 2011-08-05 ENCOUNTER — Ambulatory Visit (INDEPENDENT_AMBULATORY_CARE_PROVIDER_SITE_OTHER): Payer: Medicare Other | Admitting: Internal Medicine

## 2011-08-05 ENCOUNTER — Encounter: Payer: Self-pay | Admitting: Internal Medicine

## 2011-08-05 DIAGNOSIS — R293 Abnormal posture: Secondary | ICD-10-CM

## 2011-08-05 DIAGNOSIS — E039 Hypothyroidism, unspecified: Secondary | ICD-10-CM

## 2011-08-05 DIAGNOSIS — M799 Soft tissue disorder, unspecified: Secondary | ICD-10-CM

## 2011-08-05 DIAGNOSIS — I10 Essential (primary) hypertension: Secondary | ICD-10-CM

## 2011-08-05 DIAGNOSIS — R739 Hyperglycemia, unspecified: Secondary | ICD-10-CM

## 2011-08-05 DIAGNOSIS — R7309 Other abnormal glucose: Secondary | ICD-10-CM

## 2011-08-05 LAB — BASIC METABOLIC PANEL
BUN: 22 mg/dL (ref 6–23)
Chloride: 100 mEq/L (ref 96–112)
Glucose, Bld: 153 mg/dL — ABNORMAL HIGH (ref 70–99)
Potassium: 4.3 mEq/L (ref 3.5–5.1)

## 2011-08-05 LAB — CBC WITH DIFFERENTIAL/PLATELET
Basophils Relative: 0.5 % (ref 0.0–3.0)
Eosinophils Absolute: 0 10*3/uL (ref 0.0–0.7)
HCT: 42.1 % (ref 36.0–46.0)
Hemoglobin: 14.1 g/dL (ref 12.0–15.0)
MCHC: 33.5 g/dL (ref 30.0–36.0)
MCV: 95.9 fl (ref 78.0–100.0)
Monocytes Absolute: 0.4 10*3/uL (ref 0.1–1.0)
Neutro Abs: 5.1 10*3/uL (ref 1.4–7.7)
RBC: 4.39 Mil/uL (ref 3.87–5.11)

## 2011-08-05 NOTE — Patient Instructions (Signed)
Repeat the isometric exercises discussed 4- 5 times prior to standing if you've been seated for a period of time. Blood Pressure Goal  Ideally is an AVERAGE < 135/85. This AVERAGE should be calculated from @ least 5-7 BP readings taken @ different times of day on different days of week. You should not respond to isolated BP readings , but rather the AVERAGE for that week

## 2011-08-05 NOTE — Progress Notes (Signed)
  Subjective:    Patient ID: Kaylee Waters, female    DOB: 06-23-1946, 65 y.o.   MRN: [id number]  HPI Imbalance: Onset:initial episode in July ; "off balance ; floor seemed to be rising" Context:while walking for 10-15 min; she was sleep deprived & had eaten minimally Position change:no Benign positional vertigo symptoms:no Straining:no Pain:no Cardiac prodrome: No palpitations, irregular rhythm, heart rate change Neurologic prodrome:No  headache, numbness and tingling, weakness. No fall or change in gait Syncope:no Seizure activity:no Upper respiratory tract infection/extrinsic symptoms:no Duration:2 hrs Frequency:recurrence late August; daily X 2-3 weeks mainly in afternoon Associated signs and symptoms: Visual change (blurred/double/loss):no Hearing loss/tinnitus:no Nausea/sweating:no Chest pain:no Dyspnea:no Treatment/response:she sat down with slight improvement     Review of Systems The most recent episodes also occur in the afternoon he typically while sitting. She states she feels as if she is "slipping" out of the chair. The floor feels that this rising when she stands from the chair. She also has symptoms if she stands for a period of time. She has had one episode in the morning while walking through a store.  She has a history of slight hearing loss.  Blood pressure typically is in the range of 130/82 when seen in medical offices.     Objective:   Physical Exam Gen. appearance: Well-nourished, in no distress Eyes: Extraocular motion intact, field of vision normal, vision grossly intact with lenses, no nystagmus. Fundal exam reveals no significant hypertensive changes. Definitely there is no papilledema. ENT: Canals:some wax but TMs partially visible & normal.Tuning fork exam normal, hearing grossly decrease to whisper on R Neck: Normal range of motion, no masses, normal thyroid Cardiovascular: Rate and rhythm normal; no murmurs, gallops S 4 with slight slurring. No  carotid bruits present Muscle skeletal: Joints normal, tone, &  strength normal Neuro:no cranial nerve deficit, deep tendon  reflexes normal, gait normal,Rhomberg slightly unsteady; slight tremor Lymph: No cervical or axillary LA Skin: Warm and dry without suspicious lesions or rashes Psych: no anxiety or mood change. Normally interactive and cooperative.   Orthostatic blood pressure readings: Supine 130/92; sitting is 156/102; and standing 150/100. She had did not have classic symptoms with these maneuvers.        Assessment & Plan:  #1 sense of imbalance; no postural hypotension demonstrated. Essentially negative neurologic exam. Slightly unsteady Romberg and slight bilateral hand tremor; otherwise negative.  #2 hypertension, somewhat  labile  Plan: #1 BMET as she is on a diuretic  #2 isometric exercises prior to standing  #3 I asked her to keep a diary events.

## 2011-08-06 ENCOUNTER — Encounter: Payer: Self-pay | Admitting: *Deleted

## 2011-08-06 ENCOUNTER — Telehealth: Payer: Self-pay | Admitting: *Deleted

## 2011-08-06 NOTE — Telephone Encounter (Signed)
Please review the office note of 10/11. There was no significant change in pulse rate or blood pressure with position change. Of greatest concern would be postural hypotension. Please see lab results. Perform isometrics as discussed. If not exercising build up to 30-45 minutes of walking 3-4 times a week over 6-8 weeks to optimize cardiovascular function & eliminate this response

## 2011-08-06 NOTE — Telephone Encounter (Signed)
Pt indicated that after she started keeping her logs she found that her pulse was increased by 20 when she change from sitting to standing. Pt would like to know if this could be contributing to her symptoms and if she should be worried. .Please advise

## 2011-08-06 NOTE — Telephone Encounter (Signed)
Discuss with patient  

## 2011-09-03 ENCOUNTER — Ambulatory Visit
Admission: RE | Admit: 2011-09-03 | Discharge: 2011-09-03 | Disposition: A | Discharge status: Home / Self Care | Payer: Medicare Other | Source: Ambulatory Visit | Attending: Cardiology | Admitting: Cardiology

## 2011-09-03 ENCOUNTER — Ambulatory Visit (INDEPENDENT_AMBULATORY_CARE_PROVIDER_SITE_OTHER): Payer: Medicare Other | Admitting: Cardiology

## 2011-09-03 ENCOUNTER — Encounter: Payer: Self-pay | Admitting: Cardiology

## 2011-09-03 ENCOUNTER — Other Ambulatory Visit: Payer: Self-pay | Admitting: *Deleted

## 2011-09-03 ENCOUNTER — Other Ambulatory Visit: Payer: Medicare Other | Admitting: *Deleted

## 2011-09-03 VITALS — BP 148/77 | HR 115 | Ht 62.0 in | Wt 141.0 lb

## 2011-09-03 DIAGNOSIS — I10 Essential (primary) hypertension: Secondary | ICD-10-CM

## 2011-09-03 DIAGNOSIS — E785 Hyperlipidemia, unspecified: Secondary | ICD-10-CM

## 2011-09-03 DIAGNOSIS — E7800 Pure hypercholesterolemia, unspecified: Secondary | ICD-10-CM

## 2011-09-03 LAB — HEPATIC FUNCTION PANEL
ALT: 22 U/L (ref 0–35)
AST: 23 U/L (ref 0–37)
Albumin: 3.9 g/dL (ref 3.5–5.2)
Alkaline Phosphatase: 59 U/L (ref 39–117)
Bilirubin, Direct: 0 mg/dL (ref 0.0–0.3)
Total Bilirubin: 0.4 mg/dL (ref 0.3–1.2)
Total Protein: 6.9 g/dL (ref 6.0–8.3)

## 2011-09-03 LAB — LIPID PANEL
HDL: 69.9 mg/dL (ref >39.00)
Triglycerides: 63 mg/dL (ref 0.0–149.0)

## 2011-09-03 MED ORDER — VALSARTAN-HYDROCHLOROTHIAZIDE 160-12.5 MG PO TABS: 1.0000 | ORAL_TABLET | Freq: Every day | ORAL | Status: DC

## 2011-09-03 MED ORDER — LEVOTHYROXINE SODIUM 75 MCG PO TABS: 75.0000 ug | ORAL_TABLET | Freq: Every day | ORAL | Status: DC

## 2011-09-03 MED ORDER — ROSUVASTATIN CALCIUM 5 MG PO TABS: 5.0000 mg | ORAL_TABLET | Freq: Every day | ORAL | Status: DC

## 2011-09-03 MED ORDER — POTASSIUM CHLORIDE CRYS ER 20 MEQ PO TBCR: 20.0000 meq | EXTENDED_RELEASE_TABLET | Freq: Every day | ORAL | Status: DC

## 2011-09-03 MED ORDER — EZETIMIBE 10 MG PO TABS: 10.0000 mg | ORAL_TABLET | Freq: Every day | ORAL | Status: DC

## 2011-09-03 NOTE — Progress Notes (Signed)
Kaylee Waters Date of Birth:  July 19, 1946 [facility] / [facility] [address]   [address] [place], [place]  [address] [phone]           Fax   [phone]  History of Present Illness: This pleasant 65 year old woman is seen for a scheduled four-month followup office visit.  She has a past history of hypercholesterolemia and essential hypertension.  Recently she has not felt as well.  She feels as if the floor is shifting under her.  She had a recent medical checkup with Dr. Alwyn Ren and everything checked out well.  Her TSH was normal and her hemoglobin A1c was 6.4.  She was not anemic.  These symptoms started when her husband was hospitalized following an accident in the summer.  The symptoms have worsened over the past month.  Patient stopped taking her estrogen replacement therapy about a month ago.  Current Outpatient Prescriptions  Medication Sig Dispense Refill  . ALPRAZolam (XANAX) 1 MG tablet Take 1 mg by mouth at bedtime as needed. 1 -2 TABLETS HS       . Armodafinil (NUVIGIL) 250 MG tablet Take 250 mg by mouth daily.        Marland Kitchen aspirin 81 MG tablet Take 81 mg by mouth daily.        Marland Kitchen buPROPion (WELLBUTRIN XL) 300 MG 24 hr tablet Take 300 mg by mouth daily.        Marland Kitchen esomeprazole (NEXIUM) 40 MG capsule Take 40 mg by mouth daily.        Marland Kitchen ezetimibe (ZETIA) 10 MG tablet Take 1 tablet (10 mg total) by mouth daily.  90 tablet  3  . Glycopyrrolate (ROBINUL PO) Take by mouth. 1/2-1 TABLET DAILY       . lamoTRIgine (LAMICTAL) 100 MG tablet Take 100 mg by mouth 2 (two) times daily.       Marland Kitchen levothyroxine (SYNTHROID, LEVOTHROID) 75 MCG tablet Take 1 tablet (75 mcg total) by mouth daily. BRAND ONLY  90 tablet  3  . Polyethylene Glycol 3350 (MIRALAX PO) Take by mouth as needed.        . potassium chloride SA (K-DUR,KLOR-CON) 20 MEQ tablet Take 1 tablet (20 mEq total) by mouth daily.  90 tablet  3  . ranitidine (ZANTAC) 150 MG capsule Take 150 mg by mouth daily.       .  rosuvastatin (CRESTOR) 5 MG tablet Take 1 tablet (5 mg total) by mouth daily.  90 tablet  3  . valsartan-hydrochlorothiazide (DIOVAN-HCT) 160-12.5 MG per tablet Take 1 tablet by mouth daily.  90 tablet  3    Allergies  Allergen Reactions  . Codeine     REACTION: GI side effects  . Lipitor (Atorvastatin Calcium) Other (See Comments)    MYALGIAS  . Mevacor (Lovastatin)     MYALGIAS  . Morphine     REACTION: ITCHING  . Norvasc (Amlodipine Besylate) Other (See Comments)    EDEMA  . Zocor (Simvastatin) Other (See Comments)    MYALGIAS    Patient Active Problem List  Diagnoses  . HYPOTHYROIDISM  . VITAMIN D DEFICIENCY  . Other and unspecified hyperlipidemia  . HYPERCALCEMIA  . ANXIETY  . ACUTE LARYNGITIS, WITHOUT MENTION OF OBSTRUCTIO  . GASTROESOPHAGEAL REFLUX DISEASE, CHRONIC  . MUSCLE PAIN  . OTHER SYMPTOMS INVOLVING HEAD AND NECK  . HTN (hypertension)  . Hyperglycemia    History  Smoking status  . Former Smoker  Smokeless tobacco  . Not on  file  Comment: quit in 1975    History  Alcohol Use  . Yes    rare    Family History  Problem Relation Age of Onset  . Hyperlipidemia Mother   . Hypertension Mother   . Hypertension Brother   . Stroke Brother   . Cancer Maternal Grandmother     uterine,ovarian  . Heart disease Paternal Grandmother     mi    Review of Systems: Constitutional: no fever chills diaphoresis or fatigue or change in weight.  Head and neck: no hearing loss, no epistaxis, no photophobia or visual disturbance. Respiratory: No cough, shortness of breath or wheezing. Cardiovascular: No chest pain peripheral edema, palpitations. Gastrointestinal: No abdominal distention, no abdominal pain, no change in bowel habits hematochezia or melena. Genitourinary: No dysuria, no frequency, no urgency, no nocturia. Musculoskeletal:No arthralgias, no back pain, no gait disturbance or myalgias. Neurological: No dizziness, no headaches, no numbness, no  seizures, no syncope, no weakness, no tremors. Hematologic: No lymphadenopathy, no easy bruising. Psychiatric: No confusion, no hallucinations, no sleep disturbance.    Physical Exam: Filed Vitals:   09/03/11 1335  BP: 148/77  Pulse: 115  The patient appears to be in no distress.  Head and neck exam reveals that the pupils are equal and reactive.  The extraocular movements are full.  There is no scleral icterus.  Mouth and pharynx are benign.  No lymphadenopathy.  No carotid bruits.  The jugular venous pressure is normal.  Thyroid is not enlarged or tender.  Chest is clear to percussion and auscultation.  No rales or rhonchi.  Expansion of the chest is symmetrical.  Heart reveals no abnormal lift or heave.  First and second heart sounds are normal.  There is no murmur gallop rub or click.  The abdomen is soft and nontender.  Bowel sounds are normoactive.  There is no hepatosplenomegaly or mass.  There are no abdominal bruits.  Extremities reveal no phlebitis or edema.  Pedal pulses are good.  There is no cyanosis or clubbing.  Neurologic exam is normal strength and no lateralizing weakness.  No sensory deficits.  Integument reveals no rash  EKG today shows a low atrial rhythm at a rate of 110.  There are no ischemic changes and she does have nonspecific T-wave flattening.   Assessment / Plan: Because of her abnormal EKG and we are sending her also for a chest x-ray.  He is to continue same medication.  Her symptoms appear to have become worse after she stopped her hormone replacement therapy and she will consider going back on it to see if that relieves the symptoms.  Recheck here in 4 months for followup office visit and EKG and lab work.

## 2011-09-03 NOTE — Assessment & Plan Note (Signed)
The patient has been checking her blood pressure at home.  It has been stable.  He does note that when she gets up from a chair her pulse which has been running faster gets even faster when she stands up.  She is describing some symptoms of vasomotor instability

## 2011-09-03 NOTE — Patient Instructions (Signed)
Will have you go for Chest Xray and call with the results Your Rx's were sent to [facility] Your physician recommends that you schedule a follow-up appointment in: 4months (EKG/lp/hfp/bmet)

## 2011-09-03 NOTE — Assessment & Plan Note (Signed)
The patient has a history of hypercholesterolemia.  She has not been expressing any side effects from her statin therapy.

## 2011-09-06 ENCOUNTER — Telehealth: Payer: Self-pay | Admitting: *Deleted

## 2011-09-06 NOTE — Telephone Encounter (Signed)
Advised and mailed to patient

## 2011-09-06 NOTE — Telephone Encounter (Signed)
Message copied by Burnell Blanks on Mon Sep 06, 2011 10:30 AM ------      Message from: Cassell Clement      Created: Fri Sep 03, 2011  9:09 PM       Xray was normal. Pl report.

## 2011-09-10 DIAGNOSIS — F39 Unspecified mood [affective] disorder: Secondary | ICD-10-CM | POA: Insufficient documentation

## 2011-12-31 ENCOUNTER — Ambulatory Visit (INDEPENDENT_AMBULATORY_CARE_PROVIDER_SITE_OTHER): Payer: Medicare Other | Admitting: Cardiology

## 2011-12-31 ENCOUNTER — Other Ambulatory Visit (INDEPENDENT_AMBULATORY_CARE_PROVIDER_SITE_OTHER): Payer: Medicare Other

## 2011-12-31 ENCOUNTER — Encounter: Payer: Self-pay | Admitting: Cardiology

## 2011-12-31 VITALS — BP 120/78 | HR 80 | Ht 62.0 in | Wt 145.0 lb

## 2011-12-31 DIAGNOSIS — E785 Hyperlipidemia, unspecified: Secondary | ICD-10-CM

## 2011-12-31 DIAGNOSIS — E039 Hypothyroidism, unspecified: Secondary | ICD-10-CM

## 2011-12-31 DIAGNOSIS — E7800 Pure hypercholesterolemia, unspecified: Secondary | ICD-10-CM

## 2011-12-31 DIAGNOSIS — I119 Hypertensive heart disease without heart failure: Secondary | ICD-10-CM | POA: Insufficient documentation

## 2011-12-31 DIAGNOSIS — I10 Essential (primary) hypertension: Secondary | ICD-10-CM

## 2011-12-31 LAB — LIPID PANEL
LDL Cholesterol: 64 mg/dL (ref 0–99)
Total CHOL/HDL Ratio: 2

## 2011-12-31 LAB — HEPATIC FUNCTION PANEL
ALT: 24 U/L (ref 0–35)
AST: 31 U/L (ref 0–37)
Bilirubin, Direct: 0 mg/dL (ref 0.0–0.3)
Total Bilirubin: 0.2 mg/dL — ABNORMAL LOW (ref 0.3–1.2)

## 2011-12-31 LAB — BASIC METABOLIC PANEL
Chloride: 101 mEq/L (ref 96–112)
Potassium: 4 mEq/L (ref 3.5–5.1)

## 2011-12-31 MED ORDER — EZETIMIBE 10 MG PO TABS: 10.0000 mg | ORAL_TABLET | Freq: Every day | ORAL | Status: DC

## 2011-12-31 MED ORDER — LEVOTHYROXINE SODIUM 75 MCG PO TABS: 75.0000 ug | ORAL_TABLET | Freq: Every day | ORAL | Status: DC

## 2011-12-31 MED ORDER — ROSUVASTATIN CALCIUM 5 MG PO TABS: 5.0000 mg | ORAL_TABLET | Freq: Every day | ORAL | Status: DC

## 2011-12-31 MED ORDER — VALSARTAN-HYDROCHLOROTHIAZIDE 160-12.5 MG PO TABS: 1.0000 | ORAL_TABLET | Freq: Every day | ORAL | Status: DC

## 2011-12-31 NOTE — Progress Notes (Signed)
Kaylee Waters Date of Birth:  1945-11-20 [facility] [address] [place], [place]  [address] [phone]  Fax   [phone]  HPI: This pleasant 66 year old woman is seen for a scheduled followup office visit.  She has a past history of hypercholesterolemia and essential hypertension.  When I last saw her in November she was having problems with fast heart rate and also a feeling as if he floor that she was standing on was coming up to meet her.  She did not have any typical sensation that the room was spinning around.  These sensations have improved since last visit.  Also her pulse is no longer racing.  He had a recent TSH from her primary care provider which was negative.  Interestingly on her last visit her electrocardiogram showed a low atrial rhythm.  EKG today shows that she is back in normal sinus rhythm.  Current Outpatient Prescriptions  Medication Sig Dispense Refill  . ALPRAZolam (XANAX) 1 MG tablet Take 1 mg by mouth at bedtime as needed. 1 -2 TABLETS HS       . aspirin 81 MG tablet Take 81 mg by mouth daily.        Marland Kitchen buPROPion (WELLBUTRIN XL) 300 MG 24 hr tablet Take 300 mg by mouth daily.        Marland Kitchen esomeprazole (NEXIUM) 40 MG capsule Take 40 mg by mouth daily.        Marland Kitchen ezetimibe (ZETIA) 10 MG tablet Take 1 tablet (10 mg total) by mouth daily.  90 tablet  3  . Glycopyrrolate (ROBINUL PO) Take by mouth. 1/2-1 TABLET DAILY       . lamoTRIgine (LAMICTAL) 100 MG tablet Take 100 mg by mouth 2 (two) times daily.       Marland Kitchen levothyroxine (SYNTHROID, LEVOTHROID) 75 MCG tablet Take 1 tablet (75 mcg total) by mouth daily. BRAND ONLY  90 tablet  3  . modafinil (PROVIGIL) 200 MG tablet Take 200 mg by mouth daily.      . Polyethylene Glycol 3350 (MIRALAX PO) Take by mouth as needed.        . potassium chloride SA (K-DUR,KLOR-CON) 20 MEQ tablet Take 1 tablet (20 mEq total) by mouth daily.  90 tablet  3  . ranitidine (ZANTAC) 150 MG capsule Take 150 mg by mouth daily.        . rosuvastatin (CRESTOR) 5 MG tablet Take 1 tablet (5 mg total) by mouth daily.  90 tablet  3  . valsartan-hydrochlorothiazide (DIOVAN-HCT) 160-12.5 MG per tablet Take 1 tablet by mouth daily.  90 tablet  3    Allergies  Allergen Reactions  . Codeine     REACTION: GI side effects  . Lipitor (Atorvastatin Calcium) Other (See Comments)    MYALGIAS  . Mevacor (Lovastatin)     MYALGIAS  . Morphine     REACTION: ITCHING  . Norvasc (Amlodipine Besylate) Other (See Comments)    EDEMA  . Zocor (Simvastatin) Other (See Comments)    MYALGIAS    Patient Active Problem List  Diagnoses  . HYPOTHYROIDISM  . VITAMIN D DEFICIENCY  . Other and unspecified hyperlipidemia  . HYPERCALCEMIA  . ANXIETY  . ACUTE LARYNGITIS, WITHOUT MENTION OF OBSTRUCTIO  . GASTROESOPHAGEAL REFLUX DISEASE, CHRONIC  . MUSCLE PAIN  . OTHER SYMPTOMS INVOLVING HEAD AND NECK  . HTN (hypertension)  . Hyperglycemia  . Dyslipidemia    History  Smoking status  . Former Smoker  Smokeless tobacco  . Not  on file  Comment: quit in 1975    History  Alcohol Use  . Yes    rare    Family History  Problem Relation Age of Onset  . Hyperlipidemia Mother   . Hypertension Mother   . Hypertension Brother   . Stroke Brother   . Cancer Maternal Grandmother     uterine,ovarian  . Heart disease Paternal Grandmother     mi    Review of Systems: The patient denies any heat or cold intolerance.  No weight gain or weight loss.  The patient denies headaches or blurry vision.  There is no cough or sputum production.  The patient denies dizziness.  There is no hematuria or hematochezia.  The patient denies any muscle aches or arthritis.  The patient denies any rash.  The patient denies frequent falling or instability.  There is no history of depression or anxiety.  All other systems were reviewed and are negative.   Physical Exam: Filed Vitals:   12/31/11 1006  BP: 120/78  Pulse: 80   the general appearance reveals  a well-developed well-nourished woman in no distress.The head and neck exam reveals pupils equal and reactive.  Extraocular movements are full.  There is no scleral icterus.  The mouth and pharynx are normal.  The neck is supple.  The carotids reveal no bruits.  The jugular venous pressure is normal.  The  thyroid is not enlarged.  There is no lymphadenopathy.  The chest is clear to percussion and auscultation.  There are no rales or rhonchi.  Expansion of the chest is symmetrical.  The precordium is quiet.  The first heart sound is normal.  The second heart sound is physiologically split.  There is no murmur gallop rub or click.  There is no abnormal lift or heave.  The abdomen is soft and nontender.  The bowel sounds are normal.  The liver and spleen are not enlarged.  There are no abdominal masses.  There are no abdominal bruits.  Extremities reveal good pedal pulses.  There is no phlebitis or edema.  There is no cyanosis or clubbing.  Strength is normal and symmetrical in all extremities.  There is no lateralizing weakness.  There are no sensory deficits.  The skin is warm and dry.  There is no rash.  EKG shows normal sinus rhythm and the low atrial rhythm and is no longer seen.  Her no ischemic changes.    Assessment / Plan: Continue same medication.  We are switching her to the generic Diovan HCT 160/12.5 to save money.  Recheck in 4 months for followup office visit and fasting lab work

## 2011-12-31 NOTE — Patient Instructions (Signed)
Will obtain labs today and call you with the results (lp/bmet/hfp) Your physician recommends that you continue on your current medications as directed. Please refer to the Current Medication list given to you today. Your physician wants you to follow-up in: 4 months You will receive a reminder letter in the mail two months in advance. If you don't receive a letter, please call our office to schedule the follow-up appointment.

## 2011-12-31 NOTE — Assessment & Plan Note (Signed)
The patient has a history of dyslipidemia and is on city as well as Crestor.  She is not having any myalgias.  We will continue current therapy.  Blood work is satisfactory.

## 2011-12-31 NOTE — Assessment & Plan Note (Signed)
The patient has been doing well in terms of her blood pressure.  She's not having any chest pain or shortness of breath.  Her previous dizzy spell symptoms have improved.

## 2012-01-02 NOTE — Progress Notes (Signed)
Quick Note:  Please report to patient. The recent labs are stable. Continue same medication and careful diet. BS better. ______ 

## 2012-01-03 ENCOUNTER — Telehealth: Payer: Self-pay | Admitting: *Deleted

## 2012-01-03 NOTE — Telephone Encounter (Signed)
Mailed copy of labs and left message to call if any questions  

## 2012-01-03 NOTE — Telephone Encounter (Signed)
Message copied by Burnell Blanks on Mon Jan 03, 2012  4:32 PM ------      Message from: Cassell Clement      Created: Sun Jan 02, 2012  8:47 AM       Please report to patient.  The recent labs are stable. Continue same medication and careful diet.  BS better

## 2012-01-06 ENCOUNTER — Other Ambulatory Visit: Payer: Self-pay

## 2012-01-06 DIAGNOSIS — E039 Hypothyroidism, unspecified: Secondary | ICD-10-CM

## 2012-01-06 MED ORDER — LEVOTHYROXINE SODIUM 75 MCG PO TABS: 75.0000 ug | ORAL_TABLET | Freq: Every day | ORAL | Status: DC

## 2012-01-06 NOTE — Telephone Encounter (Signed)
.   Requested Prescriptions   Signed Prescriptions Disp Refills  . levothyroxine (SYNTHROID, LEVOTHROID) 75 MCG tablet 90 tablet 3    Sig: Take 1 tablet (75 mcg total) by mouth daily. BRAND ONLY    Authorizing Provider: Cassell Clement    Ordering User: Lacie Scotts

## 2012-03-13 ENCOUNTER — Other Ambulatory Visit: Payer: Self-pay | Admitting: *Deleted

## 2012-03-13 ENCOUNTER — Other Ambulatory Visit: Payer: Self-pay | Admitting: Cardiology

## 2012-03-13 MED ORDER — POTASSIUM CHLORIDE CRYS ER 20 MEQ PO TBCR: 20.0000 meq | EXTENDED_RELEASE_TABLET | Freq: Every day | ORAL | Status: DC

## 2012-03-13 NOTE — Telephone Encounter (Signed)
Refilled potassium

## 2012-03-13 NOTE — Telephone Encounter (Signed)
[facility] supply[facility] [address] [employer].  90 day supply - from mail order supply.

## 2012-05-05 ENCOUNTER — Ambulatory Visit (INDEPENDENT_AMBULATORY_CARE_PROVIDER_SITE_OTHER): Payer: Medicare Other | Admitting: Cardiology

## 2012-05-05 ENCOUNTER — Other Ambulatory Visit (INDEPENDENT_AMBULATORY_CARE_PROVIDER_SITE_OTHER): Payer: Medicare Other

## 2012-05-05 ENCOUNTER — Encounter: Payer: Self-pay | Admitting: Cardiology

## 2012-05-05 VITALS — BP 120/80 | HR 60 | Ht 62.0 in | Wt 145.0 lb

## 2012-05-05 DIAGNOSIS — E7800 Pure hypercholesterolemia, unspecified: Secondary | ICD-10-CM

## 2012-05-05 DIAGNOSIS — I119 Hypertensive heart disease without heart failure: Secondary | ICD-10-CM

## 2012-05-05 DIAGNOSIS — E785 Hyperlipidemia, unspecified: Secondary | ICD-10-CM

## 2012-05-05 LAB — LIPID PANEL
Cholesterol: 162 mg/dL (ref 0–200)
HDL: 69.1 mg/dL (ref >39.00)
Triglycerides: 74 mg/dL (ref 0.0–149.0)
VLDL: 14.8 mg/dL (ref 0.0–40.0)

## 2012-05-05 LAB — HEPATIC FUNCTION PANEL
ALT: 27 U/L (ref 0–35)
AST: 25 U/L (ref 0–37)
Total Bilirubin: 0.5 mg/dL (ref 0.3–1.2)
Total Protein: 7.2 g/dL (ref 6.0–8.3)

## 2012-05-05 LAB — BASIC METABOLIC PANEL
Calcium: 9.9 mg/dL (ref 8.4–10.5)
GFR: 87.3 mL/min (ref >60.00)
Glucose, Bld: 113 mg/dL — ABNORMAL HIGH (ref 70–99)
Potassium: 3.8 mEq/L (ref 3.5–5.1)
Sodium: 141 mEq/L (ref 135–145)

## 2012-05-05 NOTE — Assessment & Plan Note (Signed)
The patient has not been experiencing any chest pain or shortness of breath.  She's had no palpitations.

## 2012-05-05 NOTE — Assessment & Plan Note (Signed)
The patient is on ezetimibe and Crestor.  She is getting fasting lab work drawn today.  She denies any myalgias or side effects from the lipid-lowering medication

## 2012-05-05 NOTE — Progress Notes (Signed)
Quick Note:  Please report to patient. The recent labs are stable. Continue same medication and careful diet. ______ 

## 2012-05-05 NOTE — Progress Notes (Signed)
Ozella Rocks Date of Birth:  1945-12-31 [facility] [address] [place], [place]  [address] [phone]  Fax   [phone]  HPI: This pleasant 66 year old woman is seen for a scheduled followup visit.  She has a history of dyslipidemia and essential hypertension.  She also has a history of hypothyroidism and is on Synthroid.  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  He has been evaluated for this at [facility].  Current Outpatient Prescriptions  Medication Sig Dispense Refill  . ALPRAZolam (XANAX) 1 MG tablet Take 1 mg by mouth at bedtime as needed. 1 -2 TABLETS HS       . aspirin 81 MG tablet Take 81 mg by mouth daily.        Marland Kitchen buPROPion (WELLBUTRIN XL) 300 MG 24 hr tablet Take 300 mg by mouth daily.        Marland Kitchen esomeprazole (NEXIUM) 40 MG capsule Take 40 mg by mouth daily.        Marland Kitchen ezetimibe (ZETIA) 10 MG tablet Take 1 tablet (10 mg total) by mouth daily.  90 tablet  3  . Glycopyrrolate (ROBINUL PO) Take by mouth. 1/2-1 TABLET DAILY       . lamoTRIgine (LAMICTAL) 100 MG tablet Take 100 mg by mouth 2 (two) times daily.       Marland Kitchen levothyroxine (SYNTHROID, LEVOTHROID) 75 MCG tablet Take 1 tablet (75 mcg total) by mouth daily. BRAND ONLY  90 tablet  3  . modafinil (PROVIGIL) 200 MG tablet Take 200 mg by mouth daily.      . Polyethylene Glycol 3350 (MIRALAX PO) Take by mouth as needed.        . potassium chloride SA (K-DUR,KLOR-CON) 20 MEQ tablet Take 1 tablet (20 mEq total) by mouth daily.  30 tablet  3  . ranitidine (ZANTAC) 150 MG capsule Take 150 mg by mouth daily.       . rosuvastatin (CRESTOR) 5 MG tablet Take 1 tablet (5 mg total) by mouth daily.  90 tablet  3  . valsartan-hydrochlorothiazide (DIOVAN-HCT) 160-12.5 MG per tablet Take 1 tablet by mouth daily.  90 tablet  3  . DISCONTD: Armodafinil (NUVIGIL) 250 MG tablet Take 250 mg by mouth daily.          Allergies    Allergen Reactions  . Codeine     REACTION: GI side effects  . Lipitor (Atorvastatin Calcium) Other (See Comments)    MYALGIAS  . Mevacor (Lovastatin)     MYALGIAS  . Morphine     REACTION: ITCHING  . Norvasc (Amlodipine Besylate) Other (See Comments)    EDEMA  . Zocor (Simvastatin) Other (See Comments)    MYALGIAS    Patient Active Problem List  Diagnosis  . HYPOTHYROIDISM  . VITAMIN D DEFICIENCY  . Other and unspecified hyperlipidemia  . HYPERCALCEMIA  . ANXIETY  . ACUTE LARYNGITIS, WITHOUT MENTION OF OBSTRUCTIO  . GASTROESOPHAGEAL REFLUX DISEASE, CHRONIC  . MUSCLE PAIN  . OTHER SYMPTOMS INVOLVING HEAD AND NECK  . HTN (hypertension)  . Hyperglycemia  . Dyslipidemia  . Benign hypertensive heart disease without heart failure    History  Smoking status  . Former Smoker  Smokeless tobacco  . Not on file  Comment: quit in 1975    History  Alcohol Use  . Yes    rare    Family History  Problem Relation Age of  Onset  . Hyperlipidemia Mother   . Hypertension Mother   . Hypertension Brother   . Stroke Brother   . Cancer Maternal Grandmother     uterine,ovarian  . Heart disease Paternal Grandmother     mi    Review of Systems: The patient denies any heat or cold intolerance.  No weight gain or weight loss.  The patient denies headaches or blurry vision.  There is no cough or sputum production.  The patient denies dizziness.  There is no hematuria or hematochezia.  The patient denies any muscle aches or arthritis.  The patient denies any rash.  The patient denies frequent falling or instability.  There is no history of depression or anxiety.  All other systems were reviewed and are negative.   Physical Exam: Filed Vitals:   05/05/12 0918  BP: 120/80  Pulse: 60   the general appearance reveals a well-developed well-nourished middle-aged woman in no distress.The head and neck exam reveals pupils equal and reactive.  Extraocular movements are full.  There is no  scleral icterus.  The mouth and pharynx are normal.  The neck is supple.  The carotids reveal no bruits.  The jugular venous pressure is normal.  The  thyroid is not enlarged.  There is no lymphadenopathy.  The chest is clear to percussion and auscultation.  There are no rales or rhonchi.  Expansion of the chest is symmetrical.  The precordium is quiet.  The first heart sound is normal.  The second heart sound is physiologically split.  There is no murmur gallop rub or click.  There is no abnormal lift or heave.  The abdomen is soft and nontender.  The bowel sounds are normal.  The liver and spleen are not enlarged.  There are no abdominal masses.  There are no abdominal bruits.  Extremities reveal good pedal pulses.  There is no phlebitis or edema.  There is no cyanosis or clubbing.  Strength is normal and symmetrical in all extremities.  There is no lateralizing weakness.  There are no sensory deficits.  The skin is warm and dry.  There is no rash.      Assessment / Plan: Continue same medication.  Recheck in 4 months for followup office visit and fasting lab work

## 2012-05-05 NOTE — Patient Instructions (Addendum)
Your physician recommends that you continue on your current medications as directed. Please refer to the Current Medication list given to you today.  Your physician wants you to follow-up in: 4 months with fasting labs (lp/bmet/hfp) You will receive a reminder letter in the mail two months in advance. If you don't receive a letter, please call our office to schedule the follow-up appointment.  

## 2012-05-09 ENCOUNTER — Telehealth: Payer: Self-pay | Admitting: *Deleted

## 2012-05-09 NOTE — Telephone Encounter (Signed)
Advised patient of lab results  

## 2012-05-09 NOTE — Telephone Encounter (Signed)
Message copied by Burnell Blanks on Tue May 09, 2012  1:10 PM ------      Message from: Cassell Clement      Created: Fri May 05, 2012  8:00 PM       Please report to patient.  The recent labs are stable. Continue same medication and careful diet.

## 2012-05-10 ENCOUNTER — Encounter (HOSPITAL_COMMUNITY): Payer: Self-pay

## 2012-05-10 ENCOUNTER — Emergency Department (HOSPITAL_COMMUNITY): Payer: Medicare Other

## 2012-05-10 ENCOUNTER — Emergency Department (HOSPITAL_COMMUNITY)
Admission: EM | Admit: 2012-05-10 | Discharge: 2012-05-10 | Disposition: A | Discharge status: Home / Self Care | Payer: Medicare Other | Attending: Emergency Medicine | Admitting: Emergency Medicine

## 2012-05-10 DIAGNOSIS — S0990XA Unspecified injury of head, initial encounter: Secondary | ICD-10-CM | POA: Insufficient documentation

## 2012-05-10 DIAGNOSIS — E7800 Pure hypercholesterolemia, unspecified: Secondary | ICD-10-CM | POA: Insufficient documentation

## 2012-05-10 DIAGNOSIS — S0180XA Unspecified open wound of other part of head, initial encounter: Secondary | ICD-10-CM | POA: Insufficient documentation

## 2012-05-10 DIAGNOSIS — Z79899 Other long term (current) drug therapy: Secondary | ICD-10-CM | POA: Insufficient documentation

## 2012-05-10 DIAGNOSIS — S0181XA Laceration without foreign body of other part of head, initial encounter: Secondary | ICD-10-CM

## 2012-05-10 DIAGNOSIS — S1093XA Contusion of unspecified part of neck, initial encounter: Secondary | ICD-10-CM | POA: Insufficient documentation

## 2012-05-10 DIAGNOSIS — H5710 Ocular pain, unspecified eye: Secondary | ICD-10-CM | POA: Insufficient documentation

## 2012-05-10 DIAGNOSIS — IMO0002 Reserved for concepts with insufficient information to code with codable children: Secondary | ICD-10-CM | POA: Insufficient documentation

## 2012-05-10 DIAGNOSIS — I10 Essential (primary) hypertension: Secondary | ICD-10-CM | POA: Insufficient documentation

## 2012-05-10 DIAGNOSIS — E039 Hypothyroidism, unspecified: Secondary | ICD-10-CM | POA: Insufficient documentation

## 2012-05-10 DIAGNOSIS — S0003XA Contusion of scalp, initial encounter: Secondary | ICD-10-CM | POA: Insufficient documentation

## 2012-05-10 DIAGNOSIS — W010XXA Fall on same level from slipping, tripping and stumbling without subsequent striking against object, initial encounter: Secondary | ICD-10-CM | POA: Insufficient documentation

## 2012-05-10 HISTORY — DX: Irritable bowel syndrome, unspecified: K58.9

## 2012-05-10 HISTORY — DX: Nonrheumatic mitral (valve) prolapse: I34.1

## 2012-05-10 HISTORY — DX: Depression, unspecified: F32.A

## 2012-05-10 HISTORY — DX: Anxiety disorder, unspecified: F41.9

## 2012-05-10 HISTORY — DX: Major depressive disorder, single episode, unspecified: F32.9

## 2012-05-10 MED ORDER — ACETAMINOPHEN 325 MG PO TABS
ORAL_TABLET | ORAL | Status: AC
  Administered 2012-05-10: 650 mg
  Filled 2012-05-10: qty 2

## 2012-05-10 NOTE — ED Notes (Signed)
Dermabond at bedside per EDP request.

## 2012-05-10 NOTE — ED Notes (Signed)
Pt given discharge instructions/explained, escorted to discharge window, in no distress.

## 2012-05-10 NOTE — ED Notes (Signed)
Patient was walking into a restaraunt and tripped falling on concrete. Patient has a small laceration above left eye,  Pain right knee, left arm as well. Patient denies vision problems or nausea.

## 2012-05-10 NOTE — ED Provider Notes (Signed)
History     CSN: [id number]  Arrival date & time 05/10/12  1221   First MD Initiated Contact with Patient 05/10/12 1235      No chief complaint on file.   HPI The patient presents after a fall with headache, knee pain.  She notes that she was in her usual state of health prior to approximately 30 minutes ago.  At that point she missed a step, fell onto concrete striking her head.  She also struck her knees.  She has an angulatory since the time.  She denies any loss of consciousness, confusion, disorientation, visual changes.  She notes a dull, sore pain in both her knees and her head since the event.  She has a visible wound above her left eye notes that this has been bleeding since the event. Past Medical History  Diagnosis Date  . HTN (hypertension)   . High cholesterol   . Hyperglycemia   . Thyroid disease     hypothyroidism    Past Surgical History  Procedure Date  . Bunionectomy 1969    LEFT FOOT   . Dilation and curettage of uterus     MISCARRIAGE  . Hysterscopy     Family History  Problem Relation Age of Onset  . Hyperlipidemia Mother   . Hypertension Mother   . Hypertension Brother   . Stroke Brother   . Cancer Maternal Grandmother     uterine,ovarian  . Heart disease Paternal Grandmother     mi    History  Substance Use Topics  . Smoking status: Former Games developer  . Smokeless tobacco: Not on file   Comment: quit in 1975  . Alcohol Use: Yes     rare    OB History    Grav Para Term Preterm Abortions TAB SAB Ect Mult Living                  Review of Systems  All other systems reviewed and are negative.    Allergies  Codeine; Lipitor; Mevacor; Morphine; Norvasc; and Zocor  Home Medications   Current Outpatient Rx  Name Route Sig Dispense Refill  . ALPRAZOLAM 1 MG PO TABS Oral Take 1 mg by mouth at bedtime as needed. 1 -2 TABLETS HS     . ASPIRIN 81 MG PO TABS Oral Take 81 mg by mouth daily.      . BUPROPION HCL ER (XL) 300 MG PO TB24 Oral  Take 300 mg by mouth daily.      Marland Kitchen ESOMEPRAZOLE MAGNESIUM 40 MG PO CPDR Oral Take 40 mg by mouth daily.      Marland Kitchen EZETIMIBE 10 MG PO TABS Oral Take 1 tablet (10 mg total) by mouth daily. 90 tablet 3  . ROBINUL PO Oral Take by mouth. 1/2-1 TABLET DAILY     . LAMOTRIGINE 100 MG PO TABS Oral Take 100 mg by mouth 2 (two) times daily.     Marland Kitchen LEVOTHYROXINE SODIUM 75 MCG PO TABS Oral Take 1 tablet (75 mcg total) by mouth daily. BRAND ONLY 90 tablet 3    Dispense as written.  Marland Kitchen MODAFINIL 200 MG PO TABS Oral Take 200 mg by mouth daily.    Marland Kitchen MIRALAX PO Oral Take by mouth as needed.      Marland Kitchen POTASSIUM CHLORIDE CRYS ER 20 MEQ PO TBCR Oral Take 1 tablet (20 mEq total) by mouth daily. 30 tablet 3  . RANITIDINE HCL 150 MG PO CAPS Oral Take 150 mg by mouth daily.     Marland Kitchen  ROSUVASTATIN CALCIUM 5 MG PO TABS Oral Take 1 tablet (5 mg total) by mouth daily. 90 tablet 3  . VALSARTAN-HYDROCHLOROTHIAZIDE 160-12.5 MG PO TABS Oral Take 1 tablet by mouth daily. 90 tablet 3    BP 126/80  Pulse 90  Temp 98.2 F (36.8 C)  Resp 20  SpO2 99%  Physical Exam  Nursing note and vitals reviewed. Constitutional: She is oriented to person, place, and time. She appears well-developed and well-nourished. No distress.  HENT:  Head: Normocephalic. Head is with abrasion, with contusion and with laceration. Head is without Battle's sign, without right periorbital erythema and without left periorbital erythema. Hair is normal.    Eyes: Conjunctivae and EOM are normal. Pupils are equal, round, and reactive to light.  Cardiovascular: Normal rate and regular rhythm.   Pulmonary/Chest: Effort normal and breath sounds normal. No stridor. No respiratory distress.  Abdominal: She exhibits no distension.  Musculoskeletal: She exhibits no edema.       Both knees are mildly tender to palpation about the patella, without appreciable deformity or decreased range of motion or strength.  On the left there is no superficial abrasion.  On the right  there is an inferior lateral 2 x 2 superficial abrasion. Hips and ankles are unremarkable.   Neurological: She is alert and oriented to person, place, and time. No cranial nerve deficit.  Skin: Skin is warm and dry.  Psychiatric: She has a normal mood and affect.    ED Course  LACERATION REPAIR Date/Time: 05/10/2012 4:10 PM Performed by: Gerhard Munch Authorized by: Gerhard Munch Consent: Verbal consent obtained. Written consent not obtained. The procedure was performed in an emergent situation. Risks and benefits: risks, benefits and alternatives were discussed Consent given by: patient Patient identity confirmed: verbally with patient Time out: Immediately prior to procedure a "time out" was called to verify the correct patient, procedure, equipment, support staff and site/side marked as required. Body area: head/neck Location details: left eyebrow Laceration length: 3 cm Tendon involvement: none Nerve involvement: none Vascular damage: yes Preparation: Patient was prepped and draped in the usual sterile fashion. Irrigation solution: saline Irrigation method: tap Amount of cleaning: standard Debridement: none Degree of undermining: none Skin closure: glue Approximation: loose Approximation difficulty: simple Patient tolerance: Patient tolerated the procedure well with no immediate complications. Comments: Stellate lac approximated as much as possible.   (including critical care time)  Labs Reviewed - No data to display No results found.   No diagnosis found.   Pulse ox 99%ra, normal MDM  This 65 roll female presents after a mechanical fall with facial pain and a laceration on the left brow.  On exam the patient is in no distress, though with tenderness about the orbits her description of significant trauma her persistent head pain she had radiographic studies.  These were reassuring.  The patient's tetanus status was up-to-date.  She had a wound repair consisting  of glue to her left brow laceration.  She was discharged in stable condition.   Gerhard Munch, MD 05/10/12 [phone]

## 2012-05-10 NOTE — ED Notes (Signed)
Denies LOC. Pt has small laceration above L eye. Pt complaining of under L eye pain also, reddened.

## 2012-05-10 NOTE — ED Notes (Signed)
Pt states was walking into restaurant when she tripped and fell onto cement, hitting head, pt complaining of head pain, R knee pain and L arm pain. Pt able to move all extremities w/o difficulty, a/o x 4, no neuro deficits present. Laceration to L forehead, irrigated and cleaned.

## 2012-09-08 ENCOUNTER — Ambulatory Visit: Payer: Medicare Other | Admitting: Cardiology

## 2012-09-08 ENCOUNTER — Other Ambulatory Visit: Payer: Medicare Other

## 2012-10-13 ENCOUNTER — Telehealth: Payer: Self-pay | Admitting: Cardiology

## 2012-10-13 DIAGNOSIS — E876 Hypokalemia: Secondary | ICD-10-CM

## 2012-10-13 DIAGNOSIS — E7800 Pure hypercholesterolemia, unspecified: Secondary | ICD-10-CM

## 2012-10-13 DIAGNOSIS — I119 Hypertensive heart disease without heart failure: Secondary | ICD-10-CM

## 2012-10-13 DIAGNOSIS — E039 Hypothyroidism, unspecified: Secondary | ICD-10-CM

## 2012-10-13 NOTE — Telephone Encounter (Signed)
Explained to pt that dr/nurse out of office, pt needs written scripts for 5 med's Dr [name] prescribes, needs 90 day supply, told her she will get a call next week, pt agreed to plan.

## 2012-10-13 NOTE — Telephone Encounter (Signed)
plz return call to pt at hm#  regarding hard copy of RX.

## 2012-10-16 MED ORDER — LEVOTHYROXINE SODIUM 75 MCG PO TABS: 75.0000 ug | ORAL_TABLET | Freq: Every day | ORAL | Status: DC

## 2012-10-16 MED ORDER — EZETIMIBE 10 MG PO TABS: 10.0000 mg | ORAL_TABLET | Freq: Every day | ORAL | Status: DC

## 2012-10-16 MED ORDER — VALSARTAN-HYDROCHLOROTHIAZIDE 160-12.5 MG PO TABS: 1.0000 | ORAL_TABLET | Freq: Every day | ORAL | Status: DC

## 2012-10-16 MED ORDER — ROSUVASTATIN CALCIUM 5 MG PO TABS: 5.0000 mg | ORAL_TABLET | Freq: Every day | ORAL | Status: DC

## 2012-10-16 MED ORDER — POTASSIUM CHLORIDE CRYS ER 20 MEQ PO TBCR: 20.0000 meq | EXTENDED_RELEASE_TABLET | Freq: Every day | ORAL | Status: DC

## 2012-10-16 NOTE — Telephone Encounter (Signed)
Printed and will mail to patient.

## 2012-11-02 ENCOUNTER — Other Ambulatory Visit: Payer: Self-pay | Admitting: *Deleted

## 2012-11-02 DIAGNOSIS — E785 Hyperlipidemia, unspecified: Secondary | ICD-10-CM

## 2012-11-02 DIAGNOSIS — I119 Hypertensive heart disease without heart failure: Secondary | ICD-10-CM

## 2012-11-02 DIAGNOSIS — I10 Essential (primary) hypertension: Secondary | ICD-10-CM

## 2012-11-03 ENCOUNTER — Ambulatory Visit (INDEPENDENT_AMBULATORY_CARE_PROVIDER_SITE_OTHER): Payer: Medicare Other | Admitting: Cardiology

## 2012-11-03 ENCOUNTER — Encounter: Payer: Self-pay | Admitting: Cardiology

## 2012-11-03 ENCOUNTER — Other Ambulatory Visit (INDEPENDENT_AMBULATORY_CARE_PROVIDER_SITE_OTHER): Payer: Medicare Other

## 2012-11-03 VITALS — BP 118/78 | HR 77 | Ht 62.0 in | Wt 142.2 lb

## 2012-11-03 DIAGNOSIS — I119 Hypertensive heart disease without heart failure: Secondary | ICD-10-CM

## 2012-11-03 DIAGNOSIS — I10 Essential (primary) hypertension: Secondary | ICD-10-CM

## 2012-11-03 DIAGNOSIS — E785 Hyperlipidemia, unspecified: Secondary | ICD-10-CM

## 2012-11-03 DIAGNOSIS — E039 Hypothyroidism, unspecified: Secondary | ICD-10-CM

## 2012-11-03 LAB — BASIC METABOLIC PANEL
BUN: 16 mg/dL (ref 6–23)
Creatinine, Ser: 0.8 mg/dL (ref 0.4–1.2)
GFR: 81.82 mL/min (ref >60.00)
Potassium: 3.9 mEq/L (ref 3.5–5.1)

## 2012-11-03 LAB — HEPATIC FUNCTION PANEL
ALT: 25 U/L (ref 0–35)
Alkaline Phosphatase: 54 U/L (ref 39–117)
Bilirubin, Direct: 0.1 mg/dL (ref 0.0–0.3)
Total Protein: 6.6 g/dL (ref 6.0–8.3)

## 2012-11-03 LAB — LIPID PANEL
Cholesterol: 156 mg/dL (ref 0–200)
VLDL: 18.6 mg/dL (ref 0.0–40.0)

## 2012-11-03 NOTE — Assessment & Plan Note (Signed)
Blood pressure has been remaining stable.  She's not having any symptoms of palpitations tachycardia orthopnea paroxysmal nocturnal dyspnea or exertional chest pain

## 2012-11-03 NOTE — Progress Notes (Signed)
Ozella Rocks Date of Birth:  1946-03-27 [facility] [address] [place], [place]  [address] [phone]  Fax   [phone]  HPI: This pleasant 67 year old woman is seen for a scheduled followup visit. She has a history of dyslipidemia and essential hypertension. She also has a history of hypothyroidism and is on Synthroid.  Since last visit she has been doing well except for recent upper respiratory infection and cough.  Current Outpatient Prescriptions  Medication Sig Dispense Refill  . ALPRAZolam (XANAX) 1 MG tablet Take 1 mg by mouth at bedtime as needed. 1 -2 TABLETS HS       . aspirin 81 MG tablet Take 81 mg by mouth daily.        Marland Kitchen buPROPion (WELLBUTRIN XL) 300 MG 24 hr tablet Take 300 mg by mouth daily.        Marland Kitchen esomeprazole (NEXIUM) 40 MG capsule Take 40 mg by mouth daily.        Marland Kitchen ezetimibe (ZETIA) 10 MG tablet Take 1 tablet (10 mg total) by mouth daily.  90 tablet  3  . Glycopyrrolate (ROBINUL PO) Take 1 mg by mouth daily. 1/2-1 TABLET DAILY      . lamoTRIgine (LAMICTAL) 100 MG tablet Take 100 mg by mouth 2 (two) times daily.       Marland Kitchen levothyroxine (SYNTHROID, LEVOTHROID) 75 MCG tablet Take 1 tablet (75 mcg total) by mouth daily. BRAND ONLY  90 tablet  3  . modafinil (PROVIGIL) 200 MG tablet Take 200 mg by mouth daily.      . Polyethylene Glycol 3350 (MIRALAX PO) Take by mouth as needed.        . potassium chloride SA (K-DUR,KLOR-CON) 20 MEQ tablet Take 1 tablet (20 mEq total) by mouth daily.  90 tablet  3  . rosuvastatin (CRESTOR) 5 MG tablet Take 1 tablet (5 mg total) by mouth daily.  90 tablet  3  . valsartan-hydrochlorothiazide (DIOVAN-HCT) 160-12.5 MG per tablet Take 1 tablet by mouth daily.  90 tablet  3  . [DISCONTINUED] Armodafinil (NUVIGIL) 250 MG tablet Take 250 mg by mouth daily.          Allergies  Allergen Reactions  . Codeine     REACTION: GI side effects  . Lipitor (Atorvastatin Calcium) Other (See Comments)    MYALGIAS  .  Mevacor (Lovastatin)     MYALGIAS  . Morphine     REACTION: ITCHING  . Norvasc (Amlodipine Besylate) Other (See Comments)    EDEMA  . Zocor (Simvastatin) Other (See Comments)    MYALGIAS    Patient Active Problem List  Diagnosis  . HYPOTHYROIDISM  . VITAMIN D DEFICIENCY  . Other and unspecified hyperlipidemia  . HYPERCALCEMIA  . ANXIETY  . ACUTE LARYNGITIS, WITHOUT MENTION OF OBSTRUCTIO  . GASTROESOPHAGEAL REFLUX DISEASE, CHRONIC  . MUSCLE PAIN  . OTHER SYMPTOMS INVOLVING HEAD AND NECK  . HTN (hypertension)  . Hyperglycemia  . Dyslipidemia  . Benign hypertensive heart disease without heart failure    History  Smoking status  . Former Smoker  Smokeless tobacco  . Not on file    Comment: quit in 1975    History  Alcohol Use  . Yes    Comment: rare    Family History  Problem Relation Age of Onset  . Hyperlipidemia Mother   . Hypertension Mother   . Hypertension Brother   . Stroke Brother   . Cancer Maternal Grandmother     uterine,ovarian  .  Heart disease Paternal Grandmother     mi    Review of Systems: The patient denies any heat or cold intolerance.  No weight gain or weight loss.  The patient denies headaches or blurry vision.  There is no cough or sputum production.  The patient denies dizziness.  There is no hematuria or hematochezia.  The patient denies any muscle aches or arthritis.  The patient denies any rash.  The patient denies frequent falling or instability.  There is no history of depression or anxiety.  All other systems were reviewed and are negative.   Physical Exam: Filed Vitals:   11/03/12 0835  BP: 118/78  Pulse: 77   the general care of his reveals a thin middle-aged woman in no distress.The head and neck exam reveals pupils equal and reactive.  Extraocular movements are full.  There is no scleral icterus.  The mouth and pharynx are normal.  The neck is supple.  The carotids reveal no bruits.  The jugular venous pressure is normal.  The   thyroid is not enlarged.  There is no lymphadenopathy.  The chest is clear to percussion and auscultation.  There are no rales or rhonchi.  Expansion of the chest is symmetrical.  The precordium is quiet.  The first heart sound is normal.  The second heart sound is physiologically split.  There is no murmur gallop rub or click.  There is no abnormal lift or heave.  The abdomen is soft and nontender.  The bowel sounds are normal.  The liver and spleen are not enlarged.  There are no abdominal masses.  There are no abdominal bruits.  Extremities reveal good pedal pulses.  There is no phlebitis or edema.  There is no cyanosis or clubbing.  Strength is normal and symmetrical in all extremities.  There is no lateralizing weakness.  There are no sensory deficits.  The skin is warm and dry.  There is no rash.     Assessment / Plan: She will continue same medication.  Blood work drawn today.  She will use Mucinex for cough and congestion and she can also use Robitussin-DM. Recheck in 4 months for followup office visit and lab work.

## 2012-11-03 NOTE — Patient Instructions (Addendum)
Your physician recommends that you continue on your current medications as directed. Please refer to the Current Medication list given to you today.  Your physician wants you to follow-up in: 4 months with fasting labs (lp/bmet/hfp) You will receive a reminder letter in the mail two months in advance. If you don't receive a letter, please call our office to schedule the follow-up appointment.  

## 2012-11-03 NOTE — Assessment & Plan Note (Signed)
The patient is clinically euthyroid. 

## 2012-11-03 NOTE — Assessment & Plan Note (Signed)
The patient is monitoring her cholesterol intake.  Her weight is down 3 pounds.  Blood work today pending

## 2012-11-03 NOTE — Progress Notes (Signed)
Quick Note:  Please report to patient. The recent labs are stable. Continue same medication and careful diet. ______ 

## 2012-11-06 ENCOUNTER — Telehealth: Payer: Self-pay | Admitting: *Deleted

## 2012-11-06 NOTE — Telephone Encounter (Signed)
Message copied by Burnell Blanks on Mon Nov 06, 2012  5:50 PM ------      Message from: Kaylee Waters      Created: Fri Nov 03, 2012  9:19 PM       Please report to patient.  The recent labs are stable. Continue same medication and careful diet.

## 2012-11-06 NOTE — Telephone Encounter (Signed)
Advised patient of lab results  

## 2013-02-16 ENCOUNTER — Other Ambulatory Visit: Payer: Self-pay | Admitting: Dermatology

## 2013-03-02 ENCOUNTER — Encounter: Payer: Self-pay | Admitting: Obstetrics & Gynecology

## 2013-03-05 ENCOUNTER — Ambulatory Visit (INDEPENDENT_AMBULATORY_CARE_PROVIDER_SITE_OTHER): Payer: Medicare Other | Admitting: Certified Nurse Midwife

## 2013-03-05 ENCOUNTER — Encounter: Payer: Self-pay | Admitting: Certified Nurse Midwife

## 2013-03-05 VITALS — BP 110/64 | HR 68 | Resp 16

## 2013-03-05 DIAGNOSIS — N952 Postmenopausal atrophic vaginitis: Secondary | ICD-10-CM

## 2013-03-05 DIAGNOSIS — B373 Candidiasis of vulva and vagina: Secondary | ICD-10-CM

## 2013-03-05 DIAGNOSIS — B372 Candidiasis of skin and nail: Secondary | ICD-10-CM

## 2013-03-05 MED ORDER — TERCONAZOLE 0.4 % VA CREA: 1.0000 | TOPICAL_CREAM | Freq: Every day | VAGINAL | Status: DC

## 2013-03-05 MED ORDER — NYSTATIN-TRIAMCINOLONE 100000-0.1 UNIT/GM-% EX OINT: TOPICAL_OINTMENT | Freq: Two times a day (BID) | CUTANEOUS | Status: DC

## 2013-03-05 NOTE — Progress Notes (Signed)
67 y.o.Married Caucasian female [phone] with a 14 day(s) history of the following:burning, discharge described as white, vulvar erythema noted and atrophy, vulvar itching and cracking in perineal area Sexually active: yes Last sexual activity:unknowndays ago. Pt also reports the following associated symptoms: urinary frequency Patient has tried over the counter treatment with minimal relief.Menopausal no HRT now stopped in 2012 patient choice.  No new personal products, uses Dial soap to bathe.  No vaginal bleeding. O: Urine POCT negative Healthy female WD,WN Affect: normal orientation X 3    Exam:  Ext: red, with scaling noted, slightly swollen vulva                ZHY:QMVHQIONG: scant and thin, pH 4.0, wet prep done, Atrophy noted, tender to touch                Cx:  normal appearance                Uterus:normal size, normal shape and consistency, mid position                Adnexa: normal adnexa and no mass, fullness, tenderness  Wet Prep shows:Yeast vaginitis, and yeast dermatitis  A: Yeast vaginitis Yeast dermatitis Atrophic Vaginitis  P: Reviewed findings and association with vaginal dryness and atrophy. Questions addressed EX:[id number] 7 see order Rx: Mycolog cream see order Aveeno sitz bath or wash prn comfort.  Change soap to Dove cream base soap, to decrease drying effect. DIscussed options for treatment with vaginal atrophy/dryness of estrogen vaginal cream, OTC options or natural options of Olive or coconut oil.  Patient plans to try OTC Replens after completing Rx.    Rv 3 weeks to assess dryness/ prn    Reviewed, TL

## 2013-03-28 ENCOUNTER — Ambulatory Visit (INDEPENDENT_AMBULATORY_CARE_PROVIDER_SITE_OTHER): Payer: Medicare Other | Admitting: Certified Nurse Midwife

## 2013-03-28 VITALS — BP 122/80 | HR 72 | Resp 16

## 2013-03-28 DIAGNOSIS — B373 Candidiasis of vulva and vagina: Secondary | ICD-10-CM

## 2013-03-28 DIAGNOSIS — N952 Postmenopausal atrophic vaginitis: Secondary | ICD-10-CM

## 2013-03-28 DIAGNOSIS — N390 Urinary tract infection, site not specified: Secondary | ICD-10-CM

## 2013-03-28 DIAGNOSIS — B3731 Acute candidiasis of vulva and vagina: Secondary | ICD-10-CM

## 2013-03-28 MED ORDER — CIPROFLOXACIN HCL 500 MG PO TABS: 500.0000 mg | ORAL_TABLET | Freq: Two times a day (BID) | ORAL | Status: DC

## 2013-03-28 NOTE — Progress Notes (Signed)
67 y.o. Married Caucasian female [phone] here for follow up of yeast vaginitis treated with Terazol 7 and yeast dermatitis treated with Mycolog cream  initiated on May 12-14. Uses Olive oil for vaginal moisture. Completed all medication as directed.  Denies any symptoms of itching of increase discharge, but complaining of vaginal burning for past 3 days. Changed soap to [employer], no other new personal products.  Complaining of urinary frequency, urgency, and pain with urination and pressure for 3 days. Denies fever, chills, headache or back pain.    O: Healthy WD,WN female Affect:normal orientation x3  Skin: warm and dry Abdomen:suprapubic tenderness Pelvic exam:EXTERNAL GENITALIA: normal appearing vulva with no masses, tenderness or lesions, BUS negative Urethral meatus/Bladder: tender VAGINA: no abnormal discharge or lesions, Wet Prep/KOH negative for yeast and negative for yeast dermatitis CERVIX: no lesions or cervical motion tenderness UTERUS: normal, non tender ADNEXA: no masses palpable and nontender   A:Yeast vaginitis and yeast dermatitis resolved 2-UTI 3-Atrophic vaginitis   P: Discussed findings of yeast vaginitis and yeast dermatitis resolved 2-Discussed findings of UTI.  Discussed need to increase water intake daily. Warning signs and symptoms of UTI given. Rx Cipro see order Labs: Urine culture and micro 3-Does not want to use estrogen products, will use OTC products for moisture.  Rv prn, toc 2 weeks if culture positive.  Reviewed, TL

## 2013-03-29 LAB — URINALYSIS, MICROSCOPIC ONLY: Bacteria, UA: NONE SEEN

## 2013-04-13 ENCOUNTER — Encounter: Payer: Self-pay | Admitting: Cardiology

## 2013-04-13 ENCOUNTER — Other Ambulatory Visit (INDEPENDENT_AMBULATORY_CARE_PROVIDER_SITE_OTHER): Payer: Medicare Other

## 2013-04-13 ENCOUNTER — Ambulatory Visit (INDEPENDENT_AMBULATORY_CARE_PROVIDER_SITE_OTHER): Payer: Medicare Other | Admitting: Cardiology

## 2013-04-13 VITALS — BP 122/82 | HR 80 | Ht 62.0 in | Wt 137.8 lb

## 2013-04-13 DIAGNOSIS — I119 Hypertensive heart disease without heart failure: Secondary | ICD-10-CM

## 2013-04-13 DIAGNOSIS — E785 Hyperlipidemia, unspecified: Secondary | ICD-10-CM

## 2013-04-13 DIAGNOSIS — I10 Essential (primary) hypertension: Secondary | ICD-10-CM

## 2013-04-13 DIAGNOSIS — E039 Hypothyroidism, unspecified: Secondary | ICD-10-CM

## 2013-04-13 DIAGNOSIS — E7800 Pure hypercholesterolemia, unspecified: Secondary | ICD-10-CM

## 2013-04-13 LAB — BASIC METABOLIC PANEL
CO2: 31 mEq/L (ref 19–32)
Calcium: 10.3 mg/dL (ref 8.4–10.5)
Creatinine, Ser: 0.7 mg/dL (ref 0.4–1.2)

## 2013-04-13 LAB — LIPID PANEL
Cholesterol: 175 mg/dL (ref 0–200)
Triglycerides: 93 mg/dL (ref 0.0–149.0)

## 2013-04-13 LAB — HEPATIC FUNCTION PANEL
AST: 24 U/L (ref 0–37)
Albumin: 4.1 g/dL (ref 3.5–5.2)
Alkaline Phosphatase: 57 U/L (ref 39–117)
Total Protein: 7.2 g/dL (ref 6.0–8.3)

## 2013-04-13 NOTE — Progress Notes (Signed)
Kaylee Waters Date of Birth:  [birth year] [facility] [address] [place], [place]  [address] [phone]  Fax   [phone]   HPI:  This pleasant 67 year old woman is seen for a scheduled followup visit. She has a history of dyslipidemia and essential hypertension. She also has a history of hypothyroidism and is on Synthroid. Since last visit she has been doing well.  They have not been able to do as much traveling because her husband has frontal temporal dementia which is worsening.   Current Outpatient Prescriptions  Medication Sig Dispense Refill  . ALPRAZolam (XANAX) 1 MG tablet Take 1 mg by mouth at bedtime as needed. 1 -2 TABLETS HS       . aspirin 81 MG tablet Take 81 mg by mouth daily.        Marland Kitchen buPROPion (WELLBUTRIN XL) 300 MG 24 hr tablet Take 300 mg by mouth daily.        Marland Kitchen esomeprazole (NEXIUM) 40 MG capsule Take 40 mg by mouth daily.        Marland Kitchen ezetimibe (ZETIA) 10 MG tablet Take 1 tablet (10 mg total) by mouth daily.  90 tablet  3  . Glycopyrrolate (ROBINUL PO) Take 1 mg by mouth daily. 1/2-1 TABLET DAILY      . lamoTRIgine (LAMICTAL) 100 MG tablet Take 100 mg by mouth 2 (two) times daily.       Marland Kitchen levothyroxine (SYNTHROID, LEVOTHROID) 75 MCG tablet Take 1 tablet (75 mcg total) by mouth daily. BRAND ONLY  90 tablet  3  . modafinil (PROVIGIL) 200 MG tablet Take 200 mg by mouth daily.      . Polyethylene Glycol 3350 (MIRALAX PO) Take by mouth daily.       . potassium chloride SA (K-DUR,KLOR-CON) 20 MEQ tablet Take 1 tablet (20 mEq total) by mouth daily.  90 tablet  3  . rosuvastatin (CRESTOR) 5 MG tablet Take 1 tablet (5 mg total) by mouth daily.  90 tablet  3  . valsartan-hydrochlorothiazide (DIOVAN-HCT) 160-12.5 MG per tablet Take 1 tablet by mouth daily.  90 tablet  3  . [DISCONTINUED] Armodafinil (NUVIGIL) 250 MG tablet Take 250 mg by mouth daily.         No current facility-administered medications for this visit.    Allergies  Allergen  Reactions  . Codeine     REACTION: GI side effects  . Lipitor (Atorvastatin Calcium) Other (See Comments)    MYALGIAS  . Mevacor (Lovastatin)     MYALGIAS  . Morphine     REACTION: ITCHING  . Norvasc (Amlodipine Besylate) Other (See Comments)    EDEMA  . Zocor (Simvastatin) Other (See Comments)    MYALGIAS    Patient Active Problem List   Diagnosis Date Noted  . Other and unspecified hyperlipidemia 10/06/2007    Priority: High  . Hyperglycemia     Priority: Medium  . ANXIETY 09/28/2009    Priority: Medium  . Benign hypertensive heart disease without heart failure 12/31/2011  . Dyslipidemia 09/03/2011  . HTN (hypertension)   . MUSCLE PAIN 11/21/2009  . ACUTE LARYNGITIS, WITHOUT MENTION OF OBSTRUCTIO 10/15/2009  . HYPOTHYROIDISM 09/28/2009  . VITAMIN D DEFICIENCY 09/26/2009  . HYPERCALCEMIA 09/26/2009  . OTHER SYMPTOMS INVOLVING HEAD AND NECK 09/26/2009  . GASTROESOPHAGEAL REFLUX DISEASE, CHRONIC 10/06/2007    History  Smoking status  . Former Smoker  Smokeless tobacco  . Not on file    Comment: quit in 1975    History  Alcohol Use No    Family History  Problem Relation Age of Onset  . Hyperlipidemia Mother   . Hypertension Mother   . Ovarian cancer Mother   . Hypertension Brother   . Stroke Brother   . Cancer Maternal Grandmother     uterine,ovarian  . Heart disease Paternal Grandmother     mi    Review of Systems: The patient denies any heat or cold intolerance.  No weight gain or weight loss.  The patient denies headaches or blurry vision.  There is no cough or sputum production.  The patient denies dizziness.  There is no hematuria or hematochezia.  The patient denies any muscle aches or arthritis.  The patient denies any rash.  The patient denies frequent falling or instability.  There is no history of depression or anxiety.  All other systems were reviewed and are negative.   Physical Exam: Filed Vitals:   04/13/13 1047  BP: 122/82  Pulse: 80    the general appearance reveals a healthy-appearing woman in no distress.The head and neck exam reveals pupils equal and reactive.  Extraocular movements are full.  There is no scleral icterus.  The mouth and pharynx are normal.  The neck is supple.  The carotids reveal no bruits.  The jugular venous pressure is normal.  The  thyroid is not enlarged.  There is no lymphadenopathy.  The chest is clear to percussion and auscultation.  There are no rales or rhonchi.  Expansion of the chest is symmetrical.  The precordium is quiet.  The first heart sound is normal.  The second heart sound is physiologically split.  There is no murmur gallop rub or click.  There is no abnormal lift or heave.  The abdomen is soft and nontender.  The bowel sounds are normal.  The liver and spleen are not enlarged.  There are no abdominal masses.  There are no abdominal bruits.  Extremities reveal good pedal pulses.  There is no phlebitis or edema.  There is no cyanosis or clubbing.  Strength is normal and symmetrical in all extremities.  There is no lateralizing weakness.  There are no sensory deficits.  The skin is warm and dry.  There is no rash.      Assessment / Plan: Continue on same medication.  She tries to get regular exercise [place].  Her weight is down 5 pounds since the winter. She is considering retiring next year. She will return in 4 months for followup office visit lipid panel hepatic function panel and basal metabolic panel.

## 2013-04-13 NOTE — Patient Instructions (Addendum)
Fasting Lab work today    Your physician wants you to follow-up in: 4 months. You will receive a reminder letter in the mail two months in advance. If you don't receive a letter, please call our office to schedule the follow-up appointment.

## 2013-04-13 NOTE — Assessment & Plan Note (Signed)
The patient is clinically euthyroid on Synthroid replacement.

## 2013-04-13 NOTE — Assessment & Plan Note (Signed)
Blood pressure was remaining stable on current medication.  No headaches or dizzy spells.  No palpitations.

## 2013-04-13 NOTE — Assessment & Plan Note (Signed)
Patient has a history of dyslipidemia.  She is on ezetimibe and Crestor.  She is not having any adverse effects from the cholesterol lowering medication.  We're checking lab work today.

## 2013-04-15 NOTE — Progress Notes (Signed)
Quick Note:  Please report to patient. The recent labs are stable. Continue same medication and careful diet. ______ 

## 2013-04-16 ENCOUNTER — Telehealth: Payer: Self-pay | Admitting: *Deleted

## 2013-04-16 NOTE — Telephone Encounter (Signed)
Message copied by Burnell Blanks on Mon Apr 16, 2013 11:22 AM ------      Message from: Cassell Clement      Created: Sun Apr 15, 2013  3:56 PM       Please report to patient.  The recent labs are stable. Continue same medication and careful diet. ------

## 2013-04-16 NOTE — Telephone Encounter (Signed)
Mailed copy of labs and left message to call if any questions  

## 2013-04-20 ENCOUNTER — Ambulatory Visit (INDEPENDENT_AMBULATORY_CARE_PROVIDER_SITE_OTHER): Payer: Medicare Other | Admitting: Obstetrics & Gynecology

## 2013-04-20 ENCOUNTER — Encounter: Payer: Self-pay | Admitting: Obstetrics & Gynecology

## 2013-04-20 VITALS — BP 130/80 | HR 60 | Temp 98.1°F | Ht 62.25 in | Wt 138.0 lb

## 2013-04-20 DIAGNOSIS — R3915 Urgency of urination: Secondary | ICD-10-CM

## 2013-04-20 MED ORDER — MIRABEGRON ER 25 MG PO TB24: 25.0000 mg | ORAL_TABLET | Freq: Every day | ORAL | Status: DC

## 2013-04-20 MED ORDER — ESTROGENS, CONJUGATED 0.625 MG/GM VA CREA: TOPICAL_CREAM | Freq: Every day | VAGINAL | Status: DC

## 2013-04-20 NOTE — Patient Instructions (Addendum)
Use the premarin vaginal cream 1/2gm vaginally twice weekly and a small amount peri-urethrally daily.  If after three weeks symptoms are not better, start the Myrbetriq 25 mg daily. Please give me an update in three weeks.

## 2013-04-21 NOTE — Progress Notes (Signed)
Subjective:     Patient ID: Kaylee Waters, female   DOB: [birth year], 67 y.o.   MRN: [id number]  HPI 67 yo MWF here for follow-up after being treated for yeast vaginitis by D. Darcel Bayley, CNM.  Patient reports she is still having a lot of urinary urgency.  She feels some vaginal dryness as well.  No dysuria.  This seems worse since the yeast vaginitis but she also feels like it has just gradually been getting worse.  She feels like she needs to void almost all the time each day.  She can void and then feels like she needs to go right back to the bathroom.  She gets up twice at night but has gotten up once for years.  She always voids when she gets up so she can't tell me if she could go back to sleep or if the urgency keeps her up, no matter what.  She denies discharge or bleeding.  She went off her HRT a little over a year ago.    Review of Systems  All other systems reviewed and are negative.       Objective:   Physical Exam  Constitutional: She appears well-developed and well-nourished.  Neck: Normal range of motion. Neck supple.  Genitourinary: There is no tenderness or lesion on the right labia. There is no tenderness or lesion on the left labia. Uterus is not deviated, not enlarged, not fixed and not tender. Cervix exhibits no motion tenderness, no discharge and no friability. Right adnexum displays no mass, no tenderness and no fullness. Left adnexum displays no mass, no tenderness and no fullness. No erythema, tenderness or bleeding around the vagina. No foreign body around the vagina. No signs of injury (atrohpic appearing) around the vagina. No vaginal discharge found.  Grade 1 bladder prolapse.  No blood/discharge. Urethra/meatus/bladder otherwise normal.  Lymphadenopathy:       Right: No inguinal adenopathy present.       Left: No inguinal adenopathy present.       Assessment:     Urinary urgency, either due to OAB or atrophic vaginal changes     Plan:     Patient will start  with Premarin vaginal cream 1gm pv twice weekly and small amount externally daily.  After three weeks, she will give update.  If no significant improvement in symptoms, she will try Myrbetriq 25mg  daily.  Samples for the above given.  Follow-up OV will be planned pending results to medications.  All questions answered.

## 2013-04-22 LAB — URINE CULTURE: Colony Count: 7000

## 2013-08-02 ENCOUNTER — Encounter: Payer: Self-pay | Admitting: Obstetrics & Gynecology

## 2013-08-02 ENCOUNTER — Ambulatory Visit (INDEPENDENT_AMBULATORY_CARE_PROVIDER_SITE_OTHER): Payer: Medicare Other | Admitting: Obstetrics & Gynecology

## 2013-08-02 VITALS — BP 122/78 | HR 78 | Resp 16 | Ht 61.5 in | Wt 138.4 lb

## 2013-08-02 DIAGNOSIS — Z0000 Encounter for general adult medical examination without abnormal findings: Secondary | ICD-10-CM

## 2013-08-02 DIAGNOSIS — Z124 Encounter for screening for malignant neoplasm of cervix: Secondary | ICD-10-CM

## 2013-08-02 DIAGNOSIS — Z01419 Encounter for gynecological examination (general) (routine) without abnormal findings: Secondary | ICD-10-CM

## 2013-08-02 LAB — POCT URINALYSIS DIPSTICK
Bilirubin, UA: NEGATIVE
Glucose, UA: NEGATIVE
Ketones, UA: NEGATIVE
Nitrite, UA: NEGATIVE
Protein, UA: NEGATIVE
Urobilinogen, UA: NEGATIVE
pH, UA: 5

## 2013-08-02 NOTE — Progress Notes (Signed)
67 y.o. [id number] MarriedCaucasianF here for annual exam.  No vaginal bleeding.  Still working full time with [employer].  Retiring April 23, 2014.  Has lost weight since last year.  Pt attributes to stress.  Husband is not doing well.    Last year was having OAB symptoms.  Stopped drinking Lime-Aid and symptoms completely got better.  IBS is acting up today.  Has had multiple episodes of diarrhea.  No fever.  No abdominal pain.  Patient's last menstrual period was 03/25/2009.          Sexually active: no  The current method of family planning is none.    Exercising: yes  walking Smoker:  no  Health Maintenance: Pap:  06/23/11 WNL History of abnormal Pap:  yes MMG:  04/20/13 3D normal Colonoscopy:  2005 repeat in 10 years BMD:   04/01/09 normal TDaP:  6/10 Screening Labs: PCP, Hb today: PCP, Urine today: WBC-1+, RBC-trace   reports that she has quit smoking. She has never used smokeless tobacco. She reports that she does not drink alcohol or use illicit drugs.  Past Medical History  Diagnosis Date  . HTN (hypertension)     controlled  . High cholesterol     controlled  . Hyperglycemia   . Thyroid disease     hypothyroidism  . Mitral valve prolapse   . Depression   . Anxiety   . Vocal cord paresis   . Diverticulosis   . IBS (irritable bowel syndrome)     w/ castipatia    Past Surgical History  Procedure Laterality Date  . Bunionectomy  1969    LEFT FOOT   . Dilation and curettage of uterus      MISCARRIAGE  . Hysterscopy    . Cholecystectomy    . Knee arthroscopy Right 1989  . Hysteroscopy  6/10    polyp prometrium; D&C    Current Outpatient Prescriptions  Medication Sig Dispense Refill  . ALPRAZolam (XANAX) 1 MG tablet Take 1 mg by mouth at bedtime as needed. 1 -2 TABLETS HS       . aspirin 81 MG tablet Take 81 mg by mouth daily.        Marland Kitchen buPROPion (WELLBUTRIN XL) 300 MG 24 hr tablet Take 300 mg by mouth daily.        Marland Kitchen esomeprazole (NEXIUM) 40 MG capsule Take  40 mg by mouth daily.        Marland Kitchen ezetimibe (ZETIA) 10 MG tablet Take 1 tablet (10 mg total) by mouth daily.  90 tablet  3  . Glycopyrrolate (ROBINUL PO) Take 1 mg by mouth daily. 1/2-1 TABLET DAILY      . lamoTRIgine (LAMICTAL) 100 MG tablet Take 100 mg by mouth 2 (two) times daily.       Marland Kitchen levothyroxine (SYNTHROID, LEVOTHROID) 75 MCG tablet Take 1 tablet (75 mcg total) by mouth daily. BRAND ONLY  90 tablet  3  . modafinil (PROVIGIL) 200 MG tablet Take 200 mg by mouth daily.      . Polyethylene Glycol 3350 (MIRALAX PO) Take by mouth daily.       . potassium chloride SA (K-DUR,KLOR-CON) 20 MEQ tablet Take 1 tablet (20 mEq total) by mouth daily.  90 tablet  3  . rosuvastatin (CRESTOR) 5 MG tablet Take 1 tablet (5 mg total) by mouth daily.  90 tablet  3  . valsartan-hydrochlorothiazide (DIOVAN-HCT) 160-12.5 MG per tablet Take 1 tablet by mouth daily.  90 tablet  3  .  conjugated estrogens (PREMARIN) vaginal cream Place vaginally daily.  30 g  6  . mirabegron ER (MYRBETRIQ) 25 MG TB24 Take 1 tablet (25 mg total) by mouth daily. One po qd  30 tablet  12  . traZODone (DESYREL) 50 MG tablet       . [DISCONTINUED] Armodafinil (NUVIGIL) 250 MG tablet Take 250 mg by mouth daily.         No current facility-administered medications for this visit.    Family History  Problem Relation Age of Onset  . Hyperlipidemia Mother   . Hypertension Mother   . Ovarian cancer Mother   . Hypertension Brother   . Stroke Brother   . Cancer Maternal Grandmother     uterine,ovarian  . Heart disease Paternal Grandmother     mi    ROS:  Pertinent items are noted in HPI.  Otherwise, a comprehensive ROS was negative.  Exam:   BP 122/78  Pulse 78  Resp 16  Ht 5' 1.5" (1.562 m)  Wt 138 lb 6.4 oz (62.778 kg)  BMI 25.73 kg/m2  LMP 03/25/2009  Weight change: -8lbs   Height: 5' 1.5" (156.2 cm)  Ht Readings from Last 3 Encounters:  08/02/13 5' 1.5" (1.562 m)  04/20/13 5' 2.25" (1.581 m)  04/13/13 5\' 2"  (1.575 m)     General appearance: alert, cooperative and appears stated age Head: Normocephalic, without obvious abnormality, atraumatic Neck: no adenopathy, supple, symmetrical, trachea midline and thyroid normal to inspection and palpation Lungs: clear to auscultation bilaterally Breasts: normal appearance, no masses or tenderness Heart: regular rate and rhythm Abdomen: soft, non-tender; bowel sounds normal; no masses,  no organomegaly Extremities: extremities normal, atraumatic, no cyanosis or edema Skin: Skin color, texture, turgor normal. No rashes or lesions Lymph nodes: Cervical, supraclavicular, and axillary nodes normal. No abnormal inguinal nodes palpated Neurologic: Grossly normal   Pelvic: External genitalia:  no lesions              Urethra:  normal appearing urethra with no masses, tenderness or lesions              Bartholins and Skenes: normal                 Vagina: normal appearing vagina with normal color and discharge, no lesions              Cervix: no lesions              Pap taken: yes Bimanual Exam:  Uterus:  normal size, contour, position, consistency, mobility, non-tender              Adnexa: normal adnexa and no mass, fullness, tenderness               Rectovaginal: not done due to diarrhea today  A:  Well Woman with normal exam Right hip pain PMP, off HRT + family hx of ovarian cancer  P:   mammogram pap smear Will plan BMD with MMG next year Wynn Banker name given for evaluation.  Pt will call and schedule.   return annually or prn  An After Visit Summary was printed and given to the patient.

## 2013-08-02 NOTE — Patient Instructions (Signed)

## 2013-08-03 NOTE — Addendum Note (Signed)
Addended by: Jerene Bears on: 08/03/2013 04:49 PM   Modules accepted: Orders

## 2013-08-24 ENCOUNTER — Ambulatory Visit (INDEPENDENT_AMBULATORY_CARE_PROVIDER_SITE_OTHER): Payer: Medicare Other | Admitting: Cardiology

## 2013-08-24 ENCOUNTER — Encounter: Payer: Self-pay | Admitting: Cardiology

## 2013-08-24 VITALS — BP 120/90 | HR 88 | Ht 61.5 in | Wt 139.0 lb

## 2013-08-24 DIAGNOSIS — I119 Hypertensive heart disease without heart failure: Secondary | ICD-10-CM

## 2013-08-24 DIAGNOSIS — R079 Chest pain, unspecified: Secondary | ICD-10-CM | POA: Insufficient documentation

## 2013-08-24 DIAGNOSIS — E785 Hyperlipidemia, unspecified: Secondary | ICD-10-CM

## 2013-08-24 DIAGNOSIS — E7800 Pure hypercholesterolemia, unspecified: Secondary | ICD-10-CM

## 2013-08-24 LAB — BASIC METABOLIC PANEL
BUN: 17 mg/dL (ref 6–23)
CO2: 31 mEq/L (ref 19–32)
GFR: 98.02 mL/min (ref >60.00)
Glucose, Bld: 107 mg/dL — ABNORMAL HIGH (ref 70–99)
Potassium: 4.1 mEq/L (ref 3.5–5.1)

## 2013-08-24 LAB — LIPID PANEL
LDL Cholesterol: 76 mg/dL (ref 0–99)
Total CHOL/HDL Ratio: 2
VLDL: 16.8 mg/dL (ref 0.0–40.0)

## 2013-08-24 LAB — HEPATIC FUNCTION PANEL
ALT: 15 U/L (ref 0–35)
Bilirubin, Direct: 0 mg/dL (ref 0.0–0.3)
Total Bilirubin: 0.5 mg/dL (ref 0.3–1.2)

## 2013-08-24 NOTE — Progress Notes (Signed)
Ozella Rocks Date of Birth:  22-Oct-1946 [address] [place], [place]  [address] [phone]  Fax   [phone]   HPI:  This pleasant 67 year old woman is seen for a scheduled followup visit. She has a history of dyslipidemia and essential hypertension. She also has a history of hypothyroidism and is on Synthroid. Since last visit she has been doing well.  They have not been able to do as much traveling because her husband has frontal temporal dementia which is worsening.  The patient has had occasional brief midsternal chest discomfort occurring at rest.  She is not having any chest discomfort with exertion.  She has been under more stress.  She is working hard and anticipates retiring from work next June, partly because her husband's health has been deteriorating.  Current Outpatient Prescriptions  Medication Sig Dispense Refill  . ALPRAZolam (XANAX) 1 MG tablet Take 1 mg by mouth at bedtime as needed. 1 -2 TABLETS HS       . aspirin 81 MG tablet Take 81 mg by mouth daily.        Marland Kitchen buPROPion (WELLBUTRIN XL) 300 MG 24 hr tablet Take 300 mg by mouth daily.        Marland Kitchen esomeprazole (NEXIUM) 40 MG capsule Take 40 mg by mouth daily.        Marland Kitchen ezetimibe (ZETIA) 10 MG tablet Take 1 tablet (10 mg total) by mouth daily.  90 tablet  3  . Glycopyrrolate (ROBINUL PO) Take 1 mg by mouth daily. 1/2-1 TABLET DAILY      . lamoTRIgine (LAMICTAL) 100 MG tablet Take 100 mg by mouth 2 (two) times daily.       Marland Kitchen levothyroxine (SYNTHROID, LEVOTHROID) 75 MCG tablet Take 1 tablet (75 mcg total) by mouth daily. BRAND ONLY  90 tablet  3  . modafinil (PROVIGIL) 200 MG tablet Take 200 mg by mouth daily.      . Polyethylene Glycol 3350 (MIRALAX PO) Take by mouth daily.       . potassium chloride SA (K-DUR,KLOR-CON) 20 MEQ tablet Take 1 tablet (20 mEq total) by mouth daily.  90 tablet  3  . rosuvastatin (CRESTOR) 5 MG tablet Take 1 tablet (5 mg total) by mouth daily.  90 tablet  3  . traZODone  (DESYREL) 50 MG tablet 1/4 tab as needed      . valsartan-hydrochlorothiazide (DIOVAN-HCT) 160-12.5 MG per tablet Take 1 tablet by mouth daily.  90 tablet  3  . [DISCONTINUED] Armodafinil (NUVIGIL) 250 MG tablet Take 250 mg by mouth daily.         No current facility-administered medications for this visit.    Allergies  Allergen Reactions  . Codeine     REACTION: GI side effects  . Lipitor [Atorvastatin Calcium] Other (See Comments)    MYALGIAS  . Mevacor [Lovastatin]     MYALGIAS  . Morphine     REACTION: ITCHING  . Norvasc [Amlodipine Besylate] Other (See Comments)    EDEMA  . Zocor [Simvastatin] Other (See Comments)    MYALGIAS    Patient Active Problem List   Diagnosis Date Noted  . Other and unspecified hyperlipidemia 10/06/2007    Priority: High  . Hyperglycemia     Priority: Medium  . ANXIETY 09/28/2009    Priority: Medium  . Benign hypertensive heart disease without heart failure 12/31/2011  . Dyslipidemia 09/03/2011  . HTN (hypertension)   . MUSCLE PAIN 11/21/2009  . ACUTE LARYNGITIS, WITHOUT MENTION OF  OBSTRUCTIO 10/15/2009  . HYPOTHYROIDISM 09/28/2009  . VITAMIN D DEFICIENCY 09/26/2009  . HYPERCALCEMIA 09/26/2009  . OTHER SYMPTOMS INVOLVING HEAD AND NECK 09/26/2009  . GASTROESOPHAGEAL REFLUX DISEASE, CHRONIC 10/06/2007    History  Smoking status  . Former Smoker  Smokeless tobacco  . Never Used    Comment: quit in 1975    History  Alcohol Use No    Family History  Problem Relation Age of Onset  . Hyperlipidemia Mother   . Hypertension Mother   . Ovarian cancer Mother   . Hypertension Brother   . Stroke Brother   . Cancer Maternal Grandmother     uterine,ovarian  . Heart disease Paternal Grandmother     mi    Review of Systems: The patient denies any heat or cold intolerance.  No weight gain or weight loss.  The patient denies headaches or blurry vision.  There is no cough or sputum production.  The patient denies dizziness.  There is no  hematuria or hematochezia.  The patient denies any muscle aches or arthritis.  The patient denies any rash.  The patient denies frequent falling or instability.  There is no history of depression or anxiety.  All other systems were reviewed and are negative.   Physical Exam: Filed Vitals:   08/24/13 0855  BP: 120/90  Pulse: 88   the general appearance reveals a healthy-appearing woman in no distress.The head and neck exam reveals pupils equal and reactive.  Extraocular movements are full.  There is no scleral icterus.  The mouth and pharynx are normal.  The neck is supple.  The carotids reveal no bruits.  The jugular venous pressure is normal.  The  thyroid is not enlarged.  There is no lymphadenopathy.  The chest is clear to percussion and auscultation.  There are no rales or rhonchi.  Expansion of the chest is symmetrical.  The precordium is quiet.  The first heart sound is normal.  The second heart sound is physiologically split.  There is no murmur gallop rub or click.  There is no abnormal lift or heave.  The abdomen is soft and nontender.  The bowel sounds are normal.  The liver and spleen are not enlarged.  There are no abdominal masses.  There are no abdominal bruits.  Extremities reveal good pedal pulses.  There is no phlebitis or edema.  There is no cyanosis or clubbing.  Strength is normal and symmetrical in all extremities.  There is no lateralizing weakness.  There are no sensory deficits.  The skin is warm and dry.  There is no rash.   EKG today shows normal sinus rhythm and is within normal limits   Assessment / Plan: Continue on same medication.   She will call us if she has any worsening of her occasional chest discomfort. She will return in 4 months for followup office visit lipid panel hepatic function panel and basal metabolic panel.

## 2013-08-24 NOTE — Patient Instructions (Signed)
Will obtain labs today and call you with the results (lp/bmet/hfp)  Your physician wants you to follow-up in: 4 months with fasting labs (lp/bmet/hfp)  You will receive a reminder letter in the mail two months in advance. If you don't receive a letter, please call our office to schedule the follow-up appointment.  Your physician recommends that you continue on your current medications as directed. Please refer to the Current Medication list given to you today.

## 2013-08-24 NOTE — Assessment & Plan Note (Signed)
The patient has a history of dyslipidemia.  She is on Crestor and ezetimibe.  We are checking lab work today.  She is not having any myalgias.

## 2013-08-24 NOTE — Assessment & Plan Note (Signed)
The patient has occasional brief chest tightness at rest.  We did an electrocardiogram today which show normal sinus rhythm and no ischemic change.  The patient was reassured.  If symptoms worsen we can consider a stress test.  Her last one was in 2009 and was normal.

## 2013-08-24 NOTE — Assessment & Plan Note (Signed)
Blood pressure is normally normal.  Today her diastolic is a little high and she feels more anxious.  She will continue to monitor closely.  We will not make any changes in her medication today.

## 2013-08-26 NOTE — Progress Notes (Signed)
Quick Note:  Please report to patient. The recent labs are stable. Continue same medication and careful diet. ______ 

## 2013-08-27 ENCOUNTER — Telehealth: Payer: Self-pay | Admitting: *Deleted

## 2013-08-27 NOTE — Telephone Encounter (Signed)
Message copied by Burnell Blanks on Mon Aug 27, 2013 10:05 AM ------      Message from: Kaylee Waters      Created: Sun Aug 26, 2013  6:56 PM       Please report to patient.  The recent labs are stable. Continue same medication and careful diet. ------

## 2013-08-27 NOTE — Telephone Encounter (Signed)
Mailed copy of labs and left message to call if any questions  

## 2013-11-07 ENCOUNTER — Other Ambulatory Visit: Payer: Self-pay

## 2013-11-07 DIAGNOSIS — E7800 Pure hypercholesterolemia, unspecified: Secondary | ICD-10-CM

## 2013-11-07 DIAGNOSIS — I119 Hypertensive heart disease without heart failure: Secondary | ICD-10-CM

## 2013-11-07 MED ORDER — EZETIMIBE 10 MG PO TABS: 10.0000 mg | ORAL_TABLET | Freq: Every day | ORAL | Status: DC

## 2013-11-07 MED ORDER — VALSARTAN-HYDROCHLOROTHIAZIDE 160-12.5 MG PO TABS: 1.0000 | ORAL_TABLET | Freq: Every day | ORAL | Status: DC

## 2013-11-07 MED ORDER — ROSUVASTATIN CALCIUM 5 MG PO TABS: 5.0000 mg | ORAL_TABLET | Freq: Every day | ORAL | Status: DC

## 2013-12-06 ENCOUNTER — Other Ambulatory Visit: Payer: Self-pay

## 2013-12-06 DIAGNOSIS — E039 Hypothyroidism, unspecified: Secondary | ICD-10-CM

## 2013-12-06 MED ORDER — LEVOTHYROXINE SODIUM 75 MCG PO TABS: 75.0000 ug | ORAL_TABLET | Freq: Every day | ORAL | Status: DC

## 2013-12-11 ENCOUNTER — Other Ambulatory Visit: Payer: Self-pay

## 2013-12-11 DIAGNOSIS — E039 Hypothyroidism, unspecified: Secondary | ICD-10-CM

## 2013-12-11 MED ORDER — LEVOTHYROXINE SODIUM 75 MCG PO TABS: 75.0000 ug | ORAL_TABLET | Freq: Every day | ORAL | Status: DC

## 2013-12-14 ENCOUNTER — Ambulatory Visit (INDEPENDENT_AMBULATORY_CARE_PROVIDER_SITE_OTHER): Payer: Medicare Other | Admitting: Cardiology

## 2013-12-14 ENCOUNTER — Encounter: Payer: Self-pay | Admitting: Cardiology

## 2013-12-14 VITALS — BP 121/74 | HR 90 | Ht 61.5 in | Wt 142.0 lb

## 2013-12-14 DIAGNOSIS — E7800 Pure hypercholesterolemia, unspecified: Secondary | ICD-10-CM

## 2013-12-14 DIAGNOSIS — E039 Hypothyroidism, unspecified: Secondary | ICD-10-CM

## 2013-12-14 DIAGNOSIS — E876 Hypokalemia: Secondary | ICD-10-CM

## 2013-12-14 DIAGNOSIS — E785 Hyperlipidemia, unspecified: Secondary | ICD-10-CM

## 2013-12-14 DIAGNOSIS — I119 Hypertensive heart disease without heart failure: Secondary | ICD-10-CM

## 2013-12-14 DIAGNOSIS — K219 Gastro-esophageal reflux disease without esophagitis: Secondary | ICD-10-CM

## 2013-12-14 LAB — HEPATIC FUNCTION PANEL
ALK PHOS: 54 U/L (ref 39–117)
ALT: 22 U/L (ref 0–35)
AST: 26 U/L (ref 0–37)
Albumin: 3.8 g/dL (ref 3.5–5.2)
BILIRUBIN TOTAL: 0.2 mg/dL — AB (ref 0.3–1.2)
Bilirubin, Direct: 0 mg/dL (ref 0.0–0.3)
TOTAL PROTEIN: 6.7 g/dL (ref 6.0–8.3)

## 2013-12-14 LAB — LIPID PANEL
CHOLESTEROL: 159 mg/dL (ref 0–200)
HDL: 66.9 mg/dL (ref >39.00)
LDL Cholesterol: 74 mg/dL (ref 0–99)
Total CHOL/HDL Ratio: 2
Triglycerides: 93 mg/dL (ref 0.0–149.0)
VLDL: 18.6 mg/dL (ref 0.0–40.0)

## 2013-12-14 LAB — BASIC METABOLIC PANEL
BUN: 17 mg/dL (ref 6–23)
CALCIUM: 9.7 mg/dL (ref 8.4–10.5)
CO2: 30 meq/L (ref 19–32)
CREATININE: 0.8 mg/dL (ref 0.4–1.2)
Chloride: 104 mEq/L (ref 96–112)
GFR: 76.8 mL/min (ref >60.00)
Glucose, Bld: 115 mg/dL — ABNORMAL HIGH (ref 70–99)
Potassium: 4 mEq/L (ref 3.5–5.1)
SODIUM: 138 meq/L (ref 135–145)

## 2013-12-14 MED ORDER — POTASSIUM CHLORIDE CRYS ER 20 MEQ PO TBCR: 20.0000 meq | EXTENDED_RELEASE_TABLET | Freq: Every day | ORAL | Status: DC

## 2013-12-14 NOTE — Addendum Note (Signed)
Addended by: Regis BillPRATT, Lejon Afzal B on: 12/14/2013 09:22 AM   Modules accepted: Orders

## 2013-12-14 NOTE — Assessment & Plan Note (Signed)
Blood pressure has been remaining stable on current therapy.  No headaches or dizziness.  No exertional dyspnea.

## 2013-12-14 NOTE — Assessment & Plan Note (Signed)
The patient is on proton pump inhibitor and also on bedtime Zantac to prevent reflux which affects her vocal cords and causes her to lose her voice.  By taking both medications her symptoms have been alleviated.

## 2013-12-14 NOTE — Assessment & Plan Note (Signed)
The patient is clinically euthyroid on thyroid replacement.  Her thyroid levels will be checked by her new primary care provider Dr. Cyndia BentBadger.

## 2013-12-14 NOTE — Patient Instructions (Signed)
Will obtain labs today and call you with the results (lp.bmet.[email or web address])  Your physician recommends that you continue on your current medications as directed. Please refer to the Current Medication list given to you today.  Your physician wants you to follow-up in: July for ov and fasting labs You will receive a reminder letter in the mail two months in advance. If you don't receive a letter, please call our office to schedule the follow-up appointment.

## 2013-12-14 NOTE — Progress Notes (Signed)
Ozella RocksMeredith B Trouten Date of Birth:  Apr 22, 1946 [address] [place], [place]  [id number] [phone]  Fax   [phone]   HPI:  This pleasant 68 year old woman is seen for a scheduled followup visit. She has a history of dyslipidemia and essential hypertension. She also has a history of hypothyroidism and is on Synthroid. Since last visit she has been doing well.  They have not been able to do as much traveling because her husband has frontal temporal dementia which is worsening.  The patient is planning on retiring in June so that she can devote more time to her husband's health.  She may decide to work one day a week. Since last visit she has had no further recurrence of chest pain.  She has had no new cardiac symptoms. Current Outpatient Prescriptions  Medication Sig Dispense Refill  . ALPRAZolam (XANAX) 1 MG tablet Take 1 mg by mouth at bedtime as needed. 1 -2 TABLETS HS       . aspirin 81 MG tablet Take 81 mg by mouth daily.        Marland Kitchen. buPROPion (WELLBUTRIN XL) 300 MG 24 hr tablet Take 300 mg by mouth daily.        Marland Kitchen. esomeprazole (NEXIUM) 40 MG capsule Take 40 mg by mouth daily.        Marland Kitchen. ezetimibe (ZETIA) 10 MG tablet Take 1 tablet (10 mg total) by mouth daily.  90 tablet  3  . Glycopyrrolate (ROBINUL PO) Take 1 mg by mouth daily. 1/2-1 TABLET DAILY      . lamoTRIgine (LAMICTAL) 100 MG tablet Take 100 mg by mouth 2 (two) times daily.       Marland Kitchen. levothyroxine (SYNTHROID, LEVOTHROID) 75 MCG tablet Take 1 tablet (75 mcg total) by mouth daily. BRAND ONLY  90 tablet  3  . modafinil (PROVIGIL) 200 MG tablet Take 200 mg by mouth daily.      . Polyethylene Glycol 3350 (MIRALAX PO) Take by mouth daily.       . potassium chloride SA (K-DUR,KLOR-CON) 20 MEQ tablet Take 1 tablet (20 mEq total) by mouth daily.  90 tablet  3  . ranitidine (ZANTAC) 150 MG tablet Take 150 mg by mouth at bedtime.       .  rosuvastatin (CRESTOR) 5 MG tablet Take 1 tablet (5 mg total) by mouth daily.  90 tablet  3  . traZODone (DESYREL) 50 MG tablet 1/4 tab as needed      . valsartan-hydrochlorothiazide (DIOVAN-HCT) 160-12.5 MG per tablet Take 1 tablet by mouth daily.  90 tablet  3  . [DISCONTINUED] Armodafinil (NUVIGIL) 250 MG tablet Take 250 mg by mouth daily.         No current facility-administered medications for this visit.    Allergies  Allergen Reactions  . Codeine     REACTION: GI side effects  . Lipitor [Atorvastatin Calcium] Other (See Comments)    MYALGIAS  . Mevacor [Lovastatin]     MYALGIAS  . Morphine     REACTION: ITCHING  . Norvasc [Amlodipine Besylate] Other (See Comments)    EDEMA  . Zocor [Simvastatin] Other (See Comments)    MYALGIAS    Patient Active Problem List   Diagnosis Date Noted  . Other and unspecified hyperlipidemia 10/06/2007    Priority: High  . Hyperglycemia     Priority: Medium  . ANXIETY 09/28/2009    Priority: Medium  . Chest pain at rest 08/24/2013  . Benign hypertensive heart disease  without heart failure 12/31/2011  . Dyslipidemia 09/03/2011  . HTN (hypertension)   . MUSCLE PAIN 11/21/2009  . ACUTE LARYNGITIS, WITHOUT MENTION OF OBSTRUCTIO 10/15/2009  . HYPOTHYROIDISM 09/28/2009  . VITAMIN D DEFICIENCY 09/26/2009  . HYPERCALCEMIA 09/26/2009  . OTHER SYMPTOMS INVOLVING HEAD AND NECK 09/26/2009  . GASTROESOPHAGEAL REFLUX DISEASE, CHRONIC 10/06/2007    History  Smoking status  . Former Smoker  Smokeless tobacco  . Never Used    Comment: quit in 1975    History  Alcohol Use No    Family History  Problem Relation Age of Onset  . Hyperlipidemia Mother   . Hypertension Mother   . Ovarian cancer Mother   . Hypertension Brother   . Stroke Brother   . Cancer Maternal Grandmother     uterine,ovarian  . Heart disease Paternal Grandmother     mi    Review of Systems: The patient denies any heat or cold intolerance.  No weight gain or  weight loss.  The patient denies headaches or blurry vision.  There is no cough or sputum production.  The patient denies dizziness.  There is no hematuria or hematochezia.  The patient denies any muscle aches or arthritis.  The patient denies any rash.  The patient denies frequent falling or instability.  There is no history of depression or anxiety.  All other systems were reviewed and are negative.   Physical Exam: Filed Vitals:   12/14/13 0809  BP: 121/74  Pulse: 90   the general appearance reveals a healthy-appearing woman in no distress.The head and neck exam reveals pupils equal and reactive.  Extraocular movements are full.  There is no scleral icterus.  The mouth and pharynx are normal.  The neck is supple.  The carotids reveal no bruits.  The jugular venous pressure is normal.  The  thyroid is not enlarged.  There is no lymphadenopathy.  The chest is clear to percussion and auscultation.  There are no rales or rhonchi.  Expansion of the chest is symmetrical.  The precordium is quiet.  The first heart sound is normal.  The second heart sound is physiologically split.  There is no murmur gallop rub or click.  There is no abnormal lift or heave.  The abdomen is soft and nontender.  The bowel sounds are normal.  The liver and spleen are not enlarged.  There are no abdominal masses.  There are no abdominal bruits.  Extremities reveal good pedal pulses.  There is no phlebitis or edema.  There is no cyanosis or clubbing.  Strength is normal and symmetrical in all extremities.  There is no lateralizing weakness.  There are no sensory deficits.  The skin is warm and dry.  There is no rash.      Assessment / Plan: Continue on same medication.    She will return in July  for followup office visit lipid panel hepatic function panel and basal metabolic panel.

## 2013-12-14 NOTE — Assessment & Plan Note (Signed)
Patient has a history of significant dyslipidemia and is on Crestor and ezetimibe.  We are checking fasting labs today

## 2013-12-16 NOTE — Progress Notes (Signed)
Quick Note:  Please report to patient. The recent labs are stable. Continue same medication and careful diet. ______ 

## 2013-12-17 ENCOUNTER — Telehealth: Payer: Self-pay | Admitting: *Deleted

## 2013-12-17 NOTE — Telephone Encounter (Signed)
Mailed copy of labs and left message to call if any questions  

## 2013-12-17 NOTE — Telephone Encounter (Signed)
Message copied by Burnell BlanksPRATT, MELINDA B on Mon Dec 17, 2013 10:44 AM ------      Message from: Cassell ClementBRACKBILL, THOMAS      Created: Sun Dec 16, 2013  8:53 PM       Please report to patient.  The recent labs are stable. Continue same medication and careful diet. ------

## 2014-04-05 IMAGING — CT CT HEAD W/O CM
1 of 3 series · 15 of 30 positions shown, 19 images · non-contrast
Comparison: 10/16/2005

CT HEAD

CLINICAL DATA: Trauma to the head and face with laceration in the
region of the left orbit

CT HEAD WITHOUT CONTRAST
CT MAXILLOFACIAL WITHOUT CONTRAST
TECHNIQUE: Multidetector CT imaging of the head and maxillofacial
structures were performed using the standard protocol without
intravenous contrast. Multiplanar CT image reconstructions of the
maxillofacial structures were also generated.

[Series 4: facial st · axial · 0.34mm/px · z∈[+1416,+1542]mm · 15 of 73 slices shown, 19 images]
[im 5/73  brain]
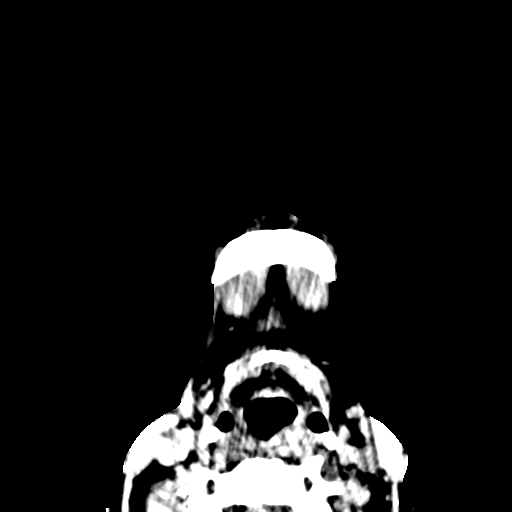
[im 5/73  bone]
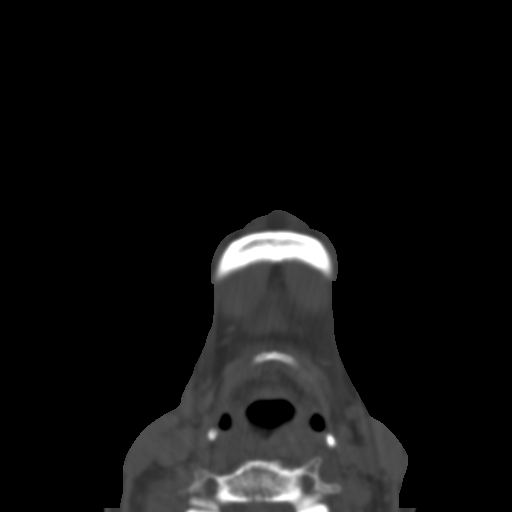
[im 10/73  brain]
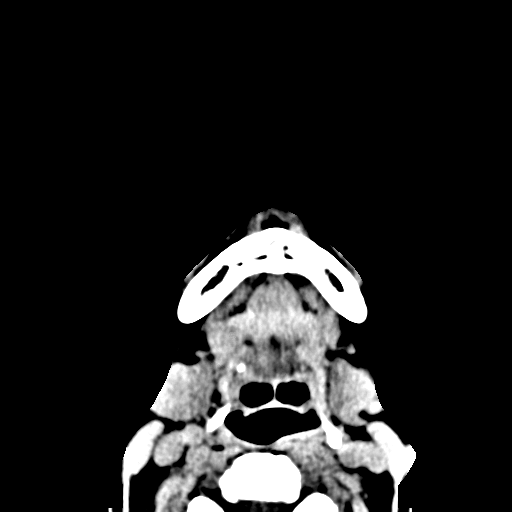
[im 14/73  brain]
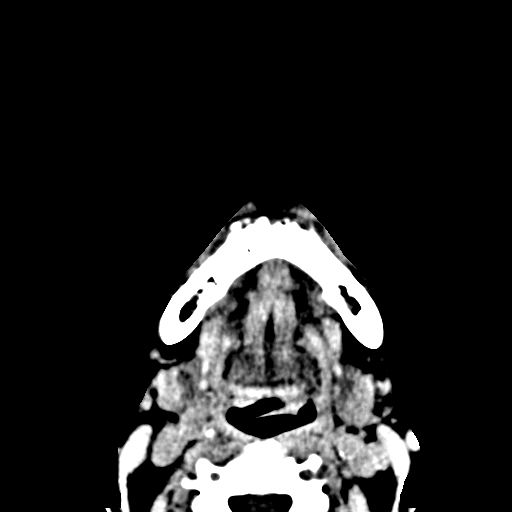
[im 19/73  brain]
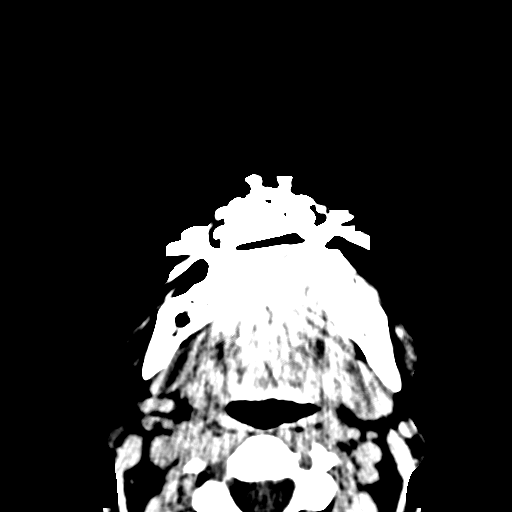
[im 23/73  brain]
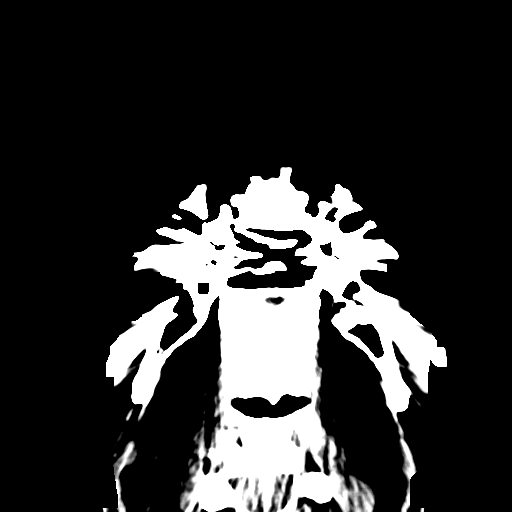
[im 23/73  bone]
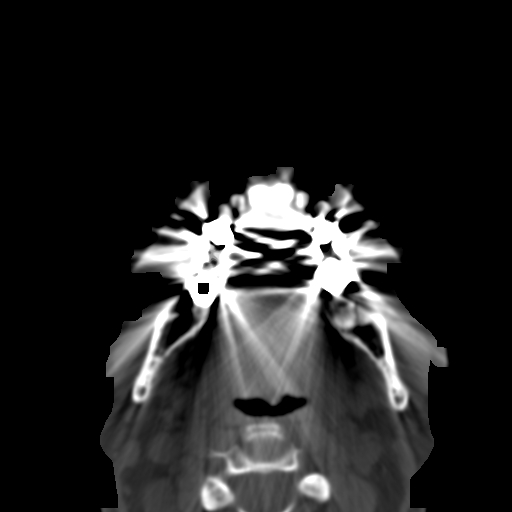
[im 28/73  brain]
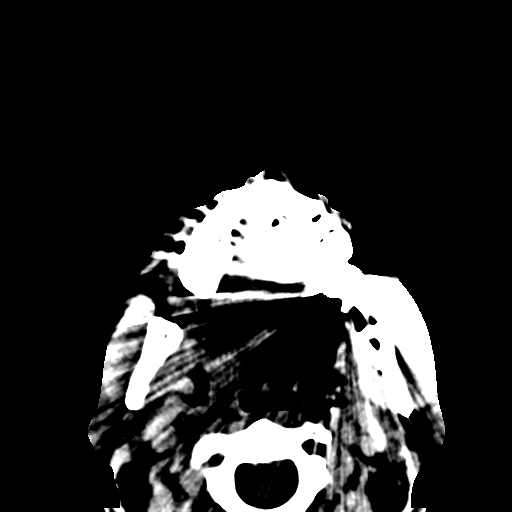
[im 32/73  brain]
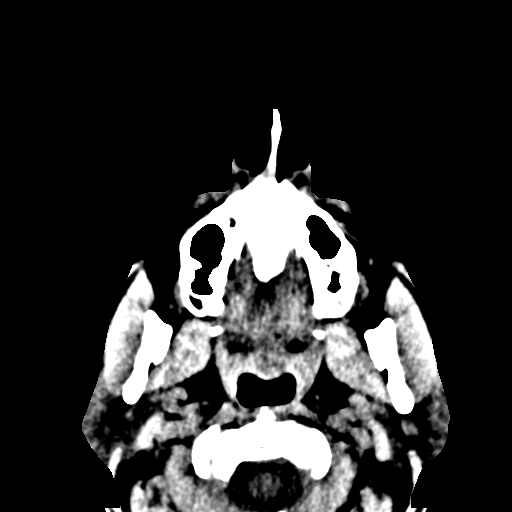
[im 37/73  brain]
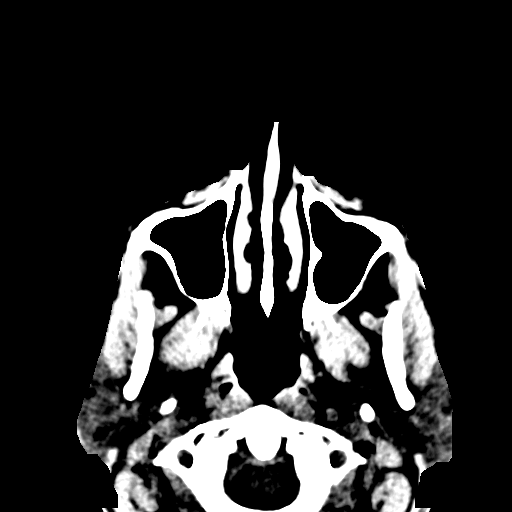
[im 41/73  brain]
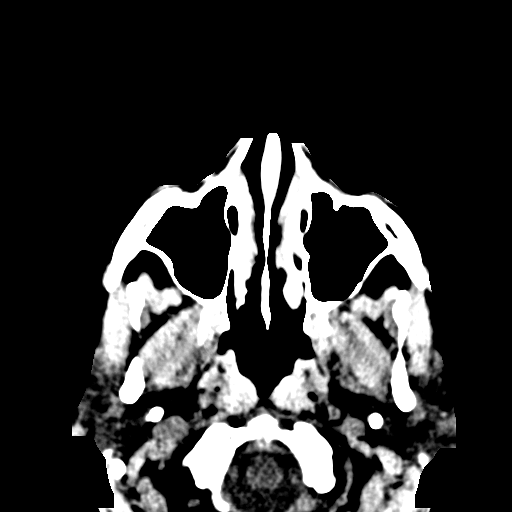
[im 41/73  bone]
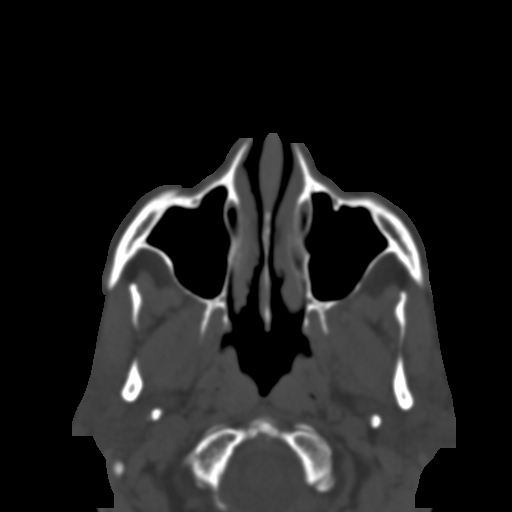
[im 46/73  brain]
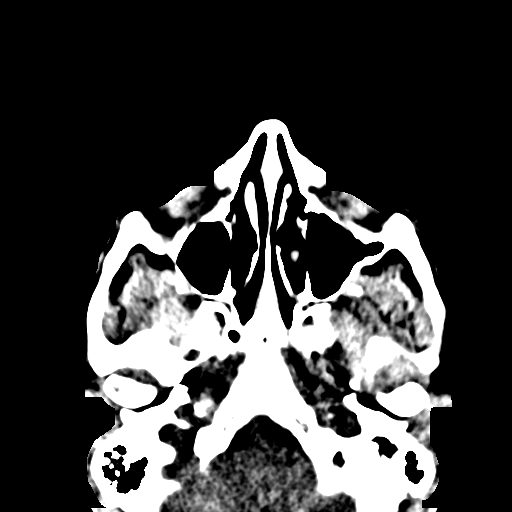
[im 50/73  brain]
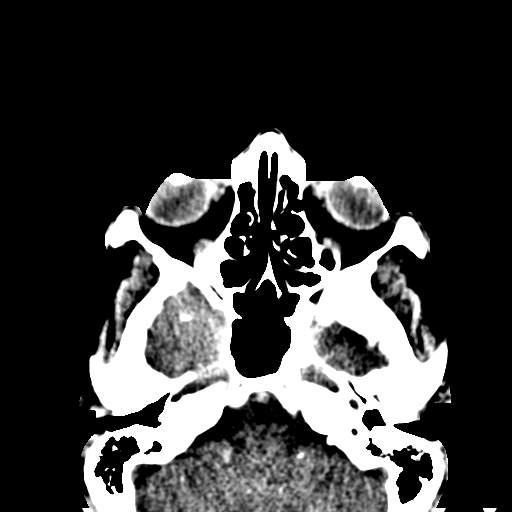
[im 55/73  brain]
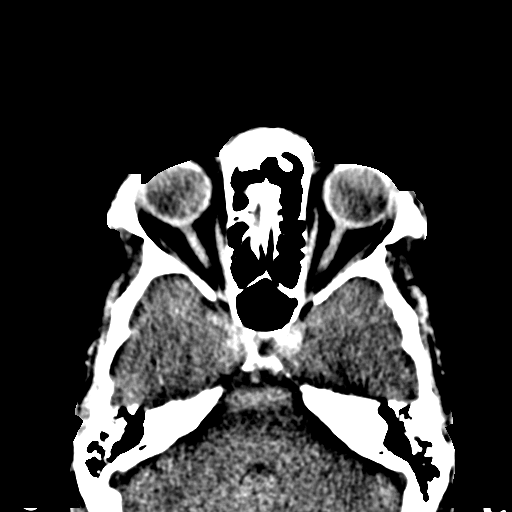
[im 59/73  brain]
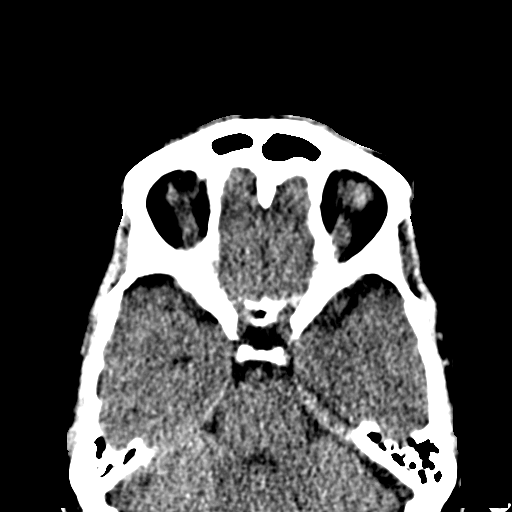
[im 59/73  bone]
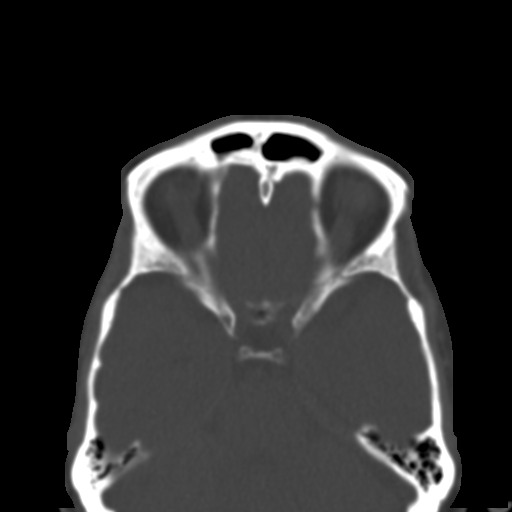
[im 64/73  brain]
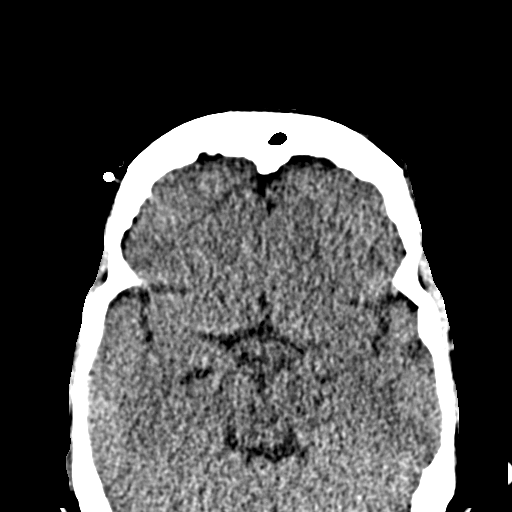
[im 68/73  brain]
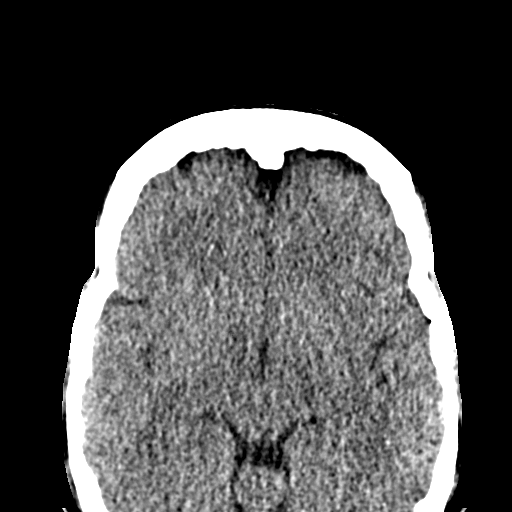

[15 of 30 positions shown; findings below may reference images not displayed]

FINDINGS: The brain has a normal appearance without evidence of
atrophy, old or acute infarction, mass lesion, hemorrhage,
hydrocephalus or extra-axial collection.  No skull fracture.  No
fluid in the sinuses.
IMPRESSION: Normal head CT

CT MAXILLOFACIAL
FINDINGS: No fluid in the sinuses, middle ears or mastoids.  No
facial fracture.  No evidence of global or orbital injury.  No
other soft tissue lesions seen.
IMPRESSION: Negative CT scan of the face

## 2014-06-10 ENCOUNTER — Other Ambulatory Visit (INDEPENDENT_AMBULATORY_CARE_PROVIDER_SITE_OTHER): Payer: Medicare Other

## 2014-06-10 ENCOUNTER — Ambulatory Visit (INDEPENDENT_AMBULATORY_CARE_PROVIDER_SITE_OTHER): Payer: Medicare Other | Admitting: Cardiology

## 2014-06-10 ENCOUNTER — Encounter: Payer: Self-pay | Admitting: Cardiology

## 2014-06-10 VITALS — BP 122/80 | HR 77 | Ht 66.0 in | Wt 142.0 lb

## 2014-06-10 DIAGNOSIS — I119 Hypertensive heart disease without heart failure: Secondary | ICD-10-CM

## 2014-06-10 DIAGNOSIS — E785 Hyperlipidemia, unspecified: Secondary | ICD-10-CM

## 2014-06-10 DIAGNOSIS — K219 Gastro-esophageal reflux disease without esophagitis: Secondary | ICD-10-CM

## 2014-06-10 DIAGNOSIS — E038 Other specified hypothyroidism: Secondary | ICD-10-CM

## 2014-06-10 LAB — HEPATIC FUNCTION PANEL
ALT: 24 U/L (ref 0–35)
AST: 28 U/L (ref 0–37)
Albumin: 3.7 g/dL (ref 3.5–5.2)
Alkaline Phosphatase: 59 U/L (ref 39–117)
Bilirubin, Direct: 0 mg/dL (ref 0.0–0.3)
TOTAL PROTEIN: 6.4 g/dL (ref 6.0–8.3)
Total Bilirubin: 0.3 mg/dL (ref 0.2–1.2)

## 2014-06-10 LAB — BASIC METABOLIC PANEL
BUN: 15 mg/dL (ref 6–23)
CO2: 31 mEq/L (ref 19–32)
Calcium: 9.5 mg/dL (ref 8.4–10.5)
Chloride: 103 mEq/L (ref 96–112)
Creatinine, Ser: 0.8 mg/dL (ref 0.4–1.2)
GFR: 79 mL/min (ref >60.00)
Glucose, Bld: 126 mg/dL — ABNORMAL HIGH (ref 70–99)
POTASSIUM: 4 meq/L (ref 3.5–5.1)
Sodium: 141 mEq/L (ref 135–145)

## 2014-06-10 LAB — LIPID PANEL
CHOLESTEROL: 163 mg/dL (ref 0–200)
HDL: 54.2 mg/dL (ref >39.00)
LDL CALC: 87 mg/dL (ref 0–99)
NonHDL: 108.8
Total CHOL/HDL Ratio: 3
Triglycerides: 110 mg/dL (ref 0.0–149.0)
VLDL: 22 mg/dL (ref 0.0–40.0)

## 2014-06-10 NOTE — Patient Instructions (Signed)
Your physician recommends that you continue on your current medications as directed. Please refer to the Current Medication list given to you today.  Your physician recommends that you return for lab work in: Lipids, Lft's and BMET at next office visit in 6 months  Your physician wants you to follow-up in: 6 months. You will receive a reminder letter in the mail two months in advance. If you don't receive a letter, please call our office to schedule the follow-up appointment.

## 2014-06-10 NOTE — Assessment & Plan Note (Signed)
The patient is on low-dose Crestor 5 mg daily.  She is not having any myalgias.  Blood work is pending.

## 2014-06-10 NOTE — Assessment & Plan Note (Signed)
On arrival her blood pressure was elevated.  Subsequent reading is normal.  She has not been having chest pain or shortness of breath.

## 2014-06-10 NOTE — Progress Notes (Signed)
Kaylee Waters Date of Birth:  September 27, 1946 [facility] [address] [place], [place]  [id number] [phone]        Fax   [phone]   History of Present Illness: This pleasant 68 year old woman is seen for a scheduled followup visit. She has a history of dyslipidemia and essential hypertension. She also has a history of hypothyroidism and is on Synthroid.  Dr. Cyndia BentBadger follows her thyroid condition. Since last visit she has been doing well. They have not been able to do as much traveling because her husband has frontal temporal dementia which is worsening.  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 Since last visit she has had no further recurrence of chest pain. She has had no new cardiac symptoms.  She has not been as physically active.   Current Outpatient Prescriptions  Medication Sig Dispense Refill  . ALPRAZolam (XANAX) 1 MG tablet Take 1 mg by mouth at bedtime as needed. 1 -2 TABLETS HS       . aspirin 81 MG tablet Take 81 mg by mouth daily.        Marland Kitchen. buPROPion (WELLBUTRIN XL) 300 MG 24 hr tablet Take 300 mg by mouth daily.        Marland Kitchen. esomeprazole (NEXIUM) 40 MG capsule Take 40 mg by mouth daily.        Marland Kitchen. ezetimibe (ZETIA) 10 MG tablet Take 1 tablet (10 mg total) by mouth daily.  90 tablet  3  . Glycopyrrolate (ROBINUL PO) Take 1 mg by mouth daily. 1/2-1 TABLET DAILY      . lamoTRIgine (LAMICTAL) 100 MG tablet Take 100 mg by mouth 2 (two) times daily.       Marland Kitchen. levothyroxine (SYNTHROID, LEVOTHROID) 75 MCG tablet Take 1 tablet (75 mcg total) by mouth daily. BRAND ONLY  90 tablet  3  . modafinil (PROVIGIL) 200 MG tablet Take 200 mg by mouth daily.      . Polyethylene Glycol 3350 (MIRALAX PO) Take by mouth daily.       . potassium chloride SA (K-DUR,KLOR-CON) 20 MEQ tablet Take 1 tablet (20 mEq total) by mouth daily.  90 tablet  3  . ranitidine (ZANTAC) 150 MG tablet Take 150 mg by mouth at bedtime.       . rosuvastatin (CRESTOR) 5  MG tablet Take 1 tablet (5 mg total) by mouth daily.  90 tablet  3  . valsartan-hydrochlorothiazide (DIOVAN-HCT) 160-12.5 MG per tablet Take 1 tablet by mouth daily.  90 tablet  3  . traZODone (DESYREL) 50 MG tablet 1/4 tab as needed      . [DISCONTINUED] Armodafinil (NUVIGIL) 250 MG tablet Take 250 mg by mouth daily.         No current facility-administered medications for this visit.    Allergies  Allergen Reactions  . Codeine     REACTION: GI side effects  . Lipitor [Atorvastatin Calcium] Other (See Comments)    MYALGIAS  . Mevacor [Lovastatin]     MYALGIAS  . Morphine     REACTION: ITCHING  . Norvasc [Amlodipine Besylate] Other (See Comments)    EDEMA  . Zocor [Simvastatin] Other (See Comments)    MYALGIAS    Patient Active Problem List   Diagnosis Date Noted  . Other and unspecified hyperlipidemia 10/06/2007    Priority: High  . Hyperglycemia     Priority: Medium  . ANXIETY 09/28/2009    Priority: Medium  .  Chest pain at rest 08/24/2013  . Benign hypertensive heart disease without heart failure 12/31/2011  . Dyslipidemia 09/03/2011  . HTN (hypertension)   . MUSCLE PAIN 11/21/2009  . ACUTE LARYNGITIS, WITHOUT MENTION OF OBSTRUCTIO 10/15/2009  . HYPOTHYROIDISM 09/28/2009  . VITAMIN D DEFICIENCY 09/26/2009  . HYPERCALCEMIA 09/26/2009  . OTHER SYMPTOMS INVOLVING HEAD AND NECK 09/26/2009  . GASTROESOPHAGEAL REFLUX DISEASE, CHRONIC 10/06/2007    History  Smoking status  . Former Smoker  Smokeless tobacco  . Never Used    Comment: quit in 1975    History  Alcohol Use No    Family History  Problem Relation Age of Onset  . Hyperlipidemia Mother   . Hypertension Mother   . Ovarian cancer Mother   . Hypertension Brother   . Stroke Brother   . Cancer Maternal Grandmother     uterine,ovarian  . Heart disease Paternal Grandmother     mi    Review of Systems: Constitutional: no fever chills diaphoresis or fatigue or change in weight.  Head and neck: no  hearing loss, no epistaxis, no photophobia or visual disturbance. Respiratory: No cough, shortness of breath or wheezing. Cardiovascular: No chest pain peripheral edema, palpitations. Gastrointestinal: No abdominal distention, no abdominal pain, no change in bowel habits hematochezia or melena. Genitourinary: No dysuria, no frequency, no urgency, no nocturia. Musculoskeletal:No arthralgias, no back pain, no gait disturbance or myalgias. Neurological: No dizziness, no headaches, no numbness, no seizures, no syncope, no weakness, no tremors. Hematologic: No lymphadenopathy, no easy bruising. Psychiatric: No confusion, no hallucinations, no sleep disturbance.    Physical Exam: Filed Vitals:   06/10/14 0840  BP: 122/80  Pulse:    the general appearance reveals a well-developed well-nourished woman in no distress.The head and neck exam reveals pupils equal and reactive.  Extraocular movements are full.  There is no scleral icterus.  The mouth and pharynx are normal.  The neck is supple.  The carotids reveal no bruits.  The jugular venous pressure is normal.  The  thyroid is not enlarged.  There is no lymphadenopathy.  The chest is clear to percussion and auscultation.  There are no rales or rhonchi.  Expansion of the chest is symmetrical.  The precordium is quiet.  The first heart sound is normal.  The second heart sound is physiologically split.  There is no murmur gallop rub or click.  There is no abnormal lift or heave.  The abdomen is soft and nontender.  The bowel sounds are normal.  The liver and spleen are not enlarged.  There are no abdominal masses.  There are no abdominal bruits.  Extremities reveal good pedal pulses.  There is no phlebitis or edema.  There is no cyanosis or clubbing.  Strength is normal and symmetrical in all extremities.  There is no lateralizing weakness.  There are no sensory deficits.  The skin is warm and dry.  There is no rash.    Assessment / Plan: 1.  Essential  hypertension without heart failure 2. Dyslipidemia 3. Anxiety 4.  GERD 5. hypothyroidism followed by Dr. Cyndia Bent  Disposition: Continue same medication.  We are checking fasting lab work today.  Recheck in 6 months for office visit lipid panel hepatic function panel and basal metabolic panel.  Try to get more regular exercise.  She is still under a lot of stress because of her husband's illness with frontal temporal dementia which is worsening.

## 2014-06-10 NOTE — Assessment & Plan Note (Signed)
The patient has not been having much symptoms from her gastroesophageal reflux which appears to be responding to medication.

## 2014-06-11 NOTE — Progress Notes (Signed)
Quick Note:  Please report to patient. The recent labs are stable. Continue same medication and careful diet. Blood sugar is higher. Watch diet. Avoid carbs and sweets ______

## 2014-06-13 ENCOUNTER — Telehealth: Payer: Self-pay | Admitting: *Deleted

## 2014-06-13 NOTE — Telephone Encounter (Signed)
pt notified about lab results with verbal understanding as to results and recommendations.

## 2014-08-19 ENCOUNTER — Ambulatory Visit (INDEPENDENT_AMBULATORY_CARE_PROVIDER_SITE_OTHER): Payer: Medicare Other | Admitting: Obstetrics & Gynecology

## 2014-08-19 ENCOUNTER — Encounter: Payer: Self-pay | Admitting: Obstetrics & Gynecology

## 2014-08-19 VITALS — BP 148/84 | HR 60 | Resp 16 | Ht 61.5 in | Wt 144.0 lb

## 2014-08-19 DIAGNOSIS — Z124 Encounter for screening for malignant neoplasm of cervix: Secondary | ICD-10-CM

## 2014-08-19 DIAGNOSIS — Z01419 Encounter for gynecological examination (general) (routine) without abnormal findings: Secondary | ICD-10-CM

## 2014-08-19 DIAGNOSIS — Z0000 Encounter for general adult medical examination without abnormal findings: Secondary | ICD-10-CM

## 2014-08-19 LAB — POCT URINALYSIS DIPSTICK
BILIRUBIN UA: NEGATIVE
GLUCOSE UA: NEGATIVE
Ketones, UA: NEGATIVE
NITRITE UA: NEGATIVE
Protein, UA: NEGATIVE
Urobilinogen, UA: NEGATIVE
pH, UA: 5

## 2014-08-19 NOTE — Progress Notes (Signed)
68 y.o. [id number] MarriedCaucasianF here for annual exam.  Has fully retired.  Last day was June 30th.  D/W pt newest guidelines for MMG screening after new [employer] recommendations.  No vaginal bleeding.    Saw Dr. Charlett BlakeVoytek for hip pain.  Was diagnosed as tendon issue.  Was not arthritis.    One daughter is getting married in [place] in January.    Patient's last menstrual period was 03/25/2009.          Sexually active: No.  The current method of family planning is vasectomy.    Exercising: No.  not since knee injury/pain Smoker:  Former smoker  Health Maintenance: Pap:  08/03/13 WNL History of abnormal Pap:  Yes years ago-cryosurgery MMG:  04/30/14-MMG, 05/02/14-repeat views-normal Colonoscopy:  2005-repeat in 10 years-scheduled 11/15, Dr. Matthias HughsBuccini BMD:   6/10 TDaP:  6/10 Screening Labs: PCP, Hb today: PCP, Urine today: WBC-1+, RBC-trace   reports that she has quit smoking. She has never used smokeless tobacco. She reports that she does not drink alcohol or use illicit drugs.  Past Medical History  Diagnosis Date  . HTN (hypertension)     controlled  . High cholesterol     controlled  . Hyperglycemia   . Thyroid disease     hypothyroidism  . Mitral valve prolapse   . Depression   . Anxiety   . Vocal cord paresis   . Diverticulosis   . IBS (irritable bowel syndrome)     w/ castipatia  . Dry eye     Past Surgical History  Procedure Laterality Date  . Bunionectomy  1969    LEFT FOOT   . Dilation and curettage of uterus      MISCARRIAGE  . Hysterscopy    . Cholecystectomy    . Knee arthroscopy Right 1989  . Hysteroscopy  6/10    polyp prometrium; D&C    Current Outpatient Prescriptions  Medication Sig Dispense Refill  . ALPRAZolam (XANAX) 1 MG tablet Take 1 mg by mouth at bedtime as needed. 1 -2 TABLETS HS       . aspirin 81 MG tablet Take 81 mg by mouth daily.        Marland Kitchen. buPROPion (WELLBUTRIN XL) 300 MG 24 hr tablet Take 300 mg by mouth daily.        Marland Kitchen.  esomeprazole (NEXIUM) 40 MG capsule Take 40 mg by mouth daily.        Marland Kitchen. ezetimibe (ZETIA) 10 MG tablet Take 1 tablet (10 mg total) by mouth daily.  90 tablet  3  . Glycopyrrolate (ROBINUL PO) Take 1 mg by mouth daily. 1/2-1 TABLET DAILY      . lamoTRIgine (LAMICTAL) 100 MG tablet Take 100 mg by mouth 2 (two) times daily.       Marland Kitchen. levothyroxine (SYNTHROID, LEVOTHROID) 75 MCG tablet Take 1 tablet (75 mcg total) by mouth daily. BRAND ONLY  90 tablet  3  . modafinil (PROVIGIL) 200 MG tablet Take 200 mg by mouth daily.      . Polyethylene Glycol 3350 (MIRALAX PO) Take by mouth daily.       . potassium chloride SA (K-DUR,KLOR-CON) 20 MEQ tablet Take 1 tablet (20 mEq total) by mouth daily.  90 tablet  3  . ranitidine (ZANTAC) 150 MG tablet Take 150 mg by mouth at bedtime.       . rosuvastatin (CRESTOR) 5 MG tablet Take 1 tablet (5 mg total) by mouth daily.  90 tablet  3  .  valsartan-hydrochlorothiazide (DIOVAN-HCT) 160-12.5 MG per tablet Take 1 tablet by mouth daily.  90 tablet  3  . [DISCONTINUED] Armodafinil (NUVIGIL) 250 MG tablet Take 250 mg by mouth daily.         No current facility-administered medications for this visit.    Family History  Problem Relation Age of Onset  . Hyperlipidemia Mother   . Hypertension Mother   . Ovarian cancer Mother   . Hypertension Brother   . Stroke Brother   . Cancer Maternal Grandmother     uterine,ovarian  . Heart disease Paternal Grandmother     mi    ROS:  Pertinent items are noted in HPI.  Otherwise, a comprehensive ROS was negative.  Exam:   BP 148/84  Pulse 60  Resp 16  Ht 5' 1.5" (1.562 m)  Wt 144 lb (65.318 kg)  BMI 26.77 kg/m2  LMP 03/25/2009  Weight change: +6#  Height: 5' 1.5" (156.2 cm)  Ht Readings from Last 3 Encounters:  08/19/14 5' 1.5" (1.562 m)  06/10/14 5\' 6"  (1.676 m)  12/14/13 5' 1.5" (1.562 m)   General appearance: alert, cooperative and appears stated age Head: Normocephalic, without obvious abnormality,  atraumatic Neck: no adenopathy, supple, symmetrical, trachea midline and thyroid normal to inspection and palpation Lungs: clear to auscultation bilaterally Breasts: normal appearance, no masses or tenderness Heart: regular rate and rhythm Abdomen: soft, non-tender; bowel sounds normal; no masses,  no organomegaly Extremities: extremities normal, atraumatic, no cyanosis or edema Skin: Skin color, texture, turgor normal. No rashes or lesions Lymph nodes: Cervical, supraclavicular, and axillary nodes normal. No abnormal inguinal nodes palpated Neurologic: Grossly normal   Pelvic: External genitalia:  no lesions              Urethra:  normal appearing urethra with no masses, tenderness or lesions              Bartholins and Skenes: normal                 Vagina: normal appearing vagina with normal color and discharge, no lesions              Cervix: no lesions              Pap taken: No. Bimanual Exam:  Uterus:  normal size, contour, position, consistency, mobility, non-tender              Adnexa: normal adnexa               Rectovaginal: Confirms               Anus:  normal sphincter tone, no lesions  A:  Well Woman with normal exam  PMP, off HRT  + family hx of ovarian cancer   P: mammogram every other year based on new guidelines pap smear  2014.  No pap today Will plan BMD 1-2 years.   return annually or prn  An After Visit Summary was printed and given to the patient.

## 2014-08-26 ENCOUNTER — Encounter: Payer: Self-pay | Admitting: Obstetrics & Gynecology

## 2014-09-24 ENCOUNTER — Other Ambulatory Visit: Payer: Self-pay | Admitting: *Deleted

## 2014-09-24 DIAGNOSIS — E7800 Pure hypercholesterolemia, unspecified: Secondary | ICD-10-CM

## 2014-09-24 DIAGNOSIS — I119 Hypertensive heart disease without heart failure: Secondary | ICD-10-CM

## 2014-09-24 MED ORDER — VALSARTAN-HYDROCHLOROTHIAZIDE 160-12.5 MG PO TABS: 1.0000 | ORAL_TABLET | Freq: Every day | ORAL | Status: DC

## 2014-09-24 MED ORDER — EZETIMIBE 10 MG PO TABS: 10.0000 mg | ORAL_TABLET | Freq: Every day | ORAL | Status: DC

## 2014-09-24 MED ORDER — ROSUVASTATIN CALCIUM 5 MG PO TABS: 5.0000 mg | ORAL_TABLET | Freq: Every day | ORAL | Status: DC

## 2014-12-11 ENCOUNTER — Ambulatory Visit: Payer: Medicare Other | Admitting: Cardiology

## 2014-12-11 ENCOUNTER — Other Ambulatory Visit: Payer: Medicare Other

## 2014-12-23 ENCOUNTER — Other Ambulatory Visit: Payer: Self-pay | Admitting: Cardiology

## 2015-01-31 ENCOUNTER — Encounter: Payer: Self-pay | Admitting: Cardiology

## 2015-01-31 ENCOUNTER — Other Ambulatory Visit (INDEPENDENT_AMBULATORY_CARE_PROVIDER_SITE_OTHER): Payer: Medicare Other | Admitting: *Deleted

## 2015-01-31 ENCOUNTER — Ambulatory Visit (INDEPENDENT_AMBULATORY_CARE_PROVIDER_SITE_OTHER): Payer: Medicare Other | Admitting: Cardiology

## 2015-01-31 VITALS — BP 126/84 | HR 78 | Ht 61.0 in | Wt 148.8 lb

## 2015-01-31 DIAGNOSIS — E785 Hyperlipidemia, unspecified: Secondary | ICD-10-CM

## 2015-01-31 DIAGNOSIS — I119 Hypertensive heart disease without heart failure: Secondary | ICD-10-CM | POA: Diagnosis not present

## 2015-01-31 DIAGNOSIS — E039 Hypothyroidism, unspecified: Secondary | ICD-10-CM | POA: Diagnosis not present

## 2015-01-31 DIAGNOSIS — E038 Other specified hypothyroidism: Secondary | ICD-10-CM

## 2015-01-31 DIAGNOSIS — E7800 Pure hypercholesterolemia, unspecified: Secondary | ICD-10-CM

## 2015-01-31 DIAGNOSIS — I10 Essential (primary) hypertension: Secondary | ICD-10-CM | POA: Diagnosis not present

## 2015-01-31 LAB — HEPATIC FUNCTION PANEL
ALT: 25 U/L (ref 0–35)
AST: 29 U/L (ref 0–37)
Albumin: 4 g/dL (ref 3.5–5.2)
Alkaline Phosphatase: 59 U/L (ref 39–117)
BILIRUBIN DIRECT: 0.1 mg/dL (ref 0.0–0.3)
BILIRUBIN TOTAL: 0.5 mg/dL (ref 0.2–1.2)
Total Protein: 6.8 g/dL (ref 6.0–8.3)

## 2015-01-31 LAB — LIPID PANEL
Cholesterol: 169 mg/dL (ref 0–200)
HDL: 59.2 mg/dL (ref >39.00)
LDL Cholesterol: 95 mg/dL (ref 0–99)
NonHDL: 109.8
Total CHOL/HDL Ratio: 3
Triglycerides: 76 mg/dL (ref 0.0–149.0)
VLDL: 15.2 mg/dL (ref 0.0–40.0)

## 2015-01-31 LAB — BASIC METABOLIC PANEL
BUN: 18 mg/dL (ref 6–23)
CHLORIDE: 101 meq/L (ref 96–112)
CO2: 29 mEq/L (ref 19–32)
Calcium: 9.8 mg/dL (ref 8.4–10.5)
Creatinine, Ser: 0.81 mg/dL (ref 0.40–1.20)
GFR: 74.37 mL/min (ref >60.00)
Glucose, Bld: 118 mg/dL — ABNORMAL HIGH (ref 70–99)
POTASSIUM: 3.9 meq/L (ref 3.5–5.1)
Sodium: 138 mEq/L (ref 135–145)

## 2015-01-31 LAB — TSH: TSH: 0.96 u[IU]/mL (ref 0.35–4.50)

## 2015-01-31 LAB — T4, FREE: Free T4: 1.03 ng/dL (ref 0.60–1.60)

## 2015-01-31 MED ORDER — LEVOTHYROXINE SODIUM 75 MCG PO TABS: 75.0000 ug | ORAL_TABLET | Freq: Every day | ORAL | Status: AC

## 2015-01-31 MED ORDER — POTASSIUM CHLORIDE CRYS ER 20 MEQ PO TBCR: EXTENDED_RELEASE_TABLET | ORAL | Status: DC

## 2015-01-31 MED ORDER — EZETIMIBE 10 MG PO TABS: 10.0000 mg | ORAL_TABLET | Freq: Every day | ORAL | Status: DC

## 2015-01-31 NOTE — Addendum Note (Signed)
Addended by: Tonita PhoenixBOWDEN, Elpidio Thielen K on: 01/31/2015 08:08 AM   Modules accepted: Orders

## 2015-01-31 NOTE — Patient Instructions (Signed)
Medication Instructions:  Your physician recommends that you continue on your current medications as directed. Please refer to the Current Medication list given to you today.  Labwork: Lp/bmet/hfp/tsh/t4  Testing/Procedures: none  Follow-Up: Your physician wants you to follow-up in: 6 months with fasting labs (lp/bmet/hfp)  You will receive a reminder letter in the mail two months in advance. If you don't receive a letter, please call our office to schedule the follow-up appointment.

## 2015-01-31 NOTE — Progress Notes (Signed)
Cardiology Office Note   Date:  01/31/2015   ID:  Kaylee Waters, DOB [birth year], MRN [id number]  PCP:  [name], MD  Cardiologist:   Cassell Clement, MD   No chief complaint on file.     History of Present Illness: Kaylee Waters is a 69 y.o. female who presents for a six-month follow-up office visit.  This pleasant 69 year old woman is seen for a scheduled followup visit. She has a history of dyslipidemia and essential hypertension. She also has a history of hypothyroidism and is on Synthroid. Dr. Cyndia Bent follows her thyroid condition. Since last visit she has been doing well. They have not been able to do as much traveling because her husband has frontal temporal dementia which is worsening. 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  However, she is thinking of going back to work 1 day a week starting in July.  She has been bored at home.  They have not been able to travel because of her husbands illnesses.  He now has diabetes and diabetic ulcers. Since last visit she has had no further recurrence of chest pain. She has had no new cardiac symptoms. She has not been as physically active.  She did start to walk again about 2 weeks ago. She denies any chest pain or shortness of breath.  Past Medical History  Diagnosis Date  . HTN (hypertension)     controlled  . High cholesterol     controlled  . Hyperglycemia   . Thyroid disease     hypothyroidism  . Mitral valve prolapse   . Depression   . Anxiety   . Vocal cord paresis   . Diverticulosis   . IBS (irritable bowel syndrome)     w/ castipatia  . Dry eye     Past Surgical History  Procedure Laterality Date  . Bunionectomy  1969    LEFT FOOT   . Dilation and curettage of uterus      MISCARRIAGE  . Hysterscopy    . Cholecystectomy    . Knee arthroscopy Right 1989  . Hysteroscopy  6/10    polyp prometrium; D&C     Current Outpatient Prescriptions  Medication Sig  Dispense Refill  . ALPRAZolam (XANAX) 1 MG tablet Take 1 mg by mouth at bedtime as needed. 1 -2 TABLETS HS     . aspirin 81 MG tablet Take 81 mg by mouth daily.      Marland Kitchen buPROPion (WELLBUTRIN XL) 300 MG 24 hr tablet Take 300 mg by mouth daily.      Marland Kitchen esomeprazole (NEXIUM) 40 MG capsule Take 40 mg by mouth daily.      Marland Kitchen ezetimibe (ZETIA) 10 MG tablet Take 1 tablet (10 mg total) by mouth daily. 90 tablet 1  . Glycopyrrolate (ROBINUL PO) Take 1 mg by mouth daily. 1/2-1 TABLET DAILY    . lamoTRIgine (LAMICTAL) 100 MG tablet Take 100 mg by mouth 2 (two) times daily.     Marland Kitchen levothyroxine (SYNTHROID, LEVOTHROID) 75 MCG tablet Take 1 tablet (75 mcg total) by mouth daily. BRAND ONLY 90 tablet 3  . modafinil (PROVIGIL) 200 MG tablet Take 200 mg by mouth daily.    . Polyethylene Glycol 3350 (MIRALAX PO) Take by mouth daily.     . potassium chloride SA (K-DUR,KLOR-CON) 20 MEQ tablet TAKE 1 BY MOUTH DAILY 90 tablet 0  . ranitidine (ZANTAC) 150 MG tablet Take 150 mg by mouth at bedtime.     Marland Kitchen  rosuvastatin (CRESTOR) 5 MG tablet Take 1 tablet (5 mg total) by mouth daily. 90 tablet 1  . valsartan-hydrochlorothiazide (DIOVAN-HCT) 160-12.5 MG per tablet Take 1 tablet by mouth daily. 90 tablet 1  . [DISCONTINUED] Armodafinil (NUVIGIL) 250 MG tablet Take 250 mg by mouth daily.       No current facility-administered medications for this visit.    Allergies:   Codeine; Lipitor; Mevacor; Morphine; Norvasc; and Zocor    Social History:  The patient  reports that she has quit smoking. She has never used smokeless tobacco. She reports that she does not drink alcohol or use illicit drugs.   Family History:  The patient's family history includes Cancer in her maternal grandmother; Heart disease in her paternal grandmother; Hyperlipidemia in her mother; Hypertension in her brother and mother; Ovarian cancer in her mother; Stroke in her brother.    ROS:  Please see the history of present illness.   Otherwise, review of  systems are positive for none.   All other systems are reviewed and negative.    PHYSICAL EXAM: VS:  BP 126/84 mmHg  Pulse 78  Ht 5\' 1"  (1.549 m)  Wt 148 lb 12.8 oz (67.495 kg)  BMI 28.13 kg/m2  LMP 03/25/2009 , BMI Body mass index is 28.13 kg/(m^2). GEN: Well nourished, well developed, in no acute distress HEENT: normal Neck: no JVD, carotid bruits, or masses Cardiac: RRR; no murmurs, rubs, or gallops,no edema  Respiratory:  clear to auscultation bilaterally, normal work of breathing GI: soft, nontender, nondistended, + BS MS: no deformity or atrophy Skin: warm and dry, no rash Neuro:  Strength and sensation are intact Psych: euthymic mood, full affect   EKG:  EKG is ordered today. The ekg ordered today demonstrates normal sinus rhythm.  Within normal limits.   Recent Labs: 06/10/2014: ALT 24; BUN 15; Creatinine 0.8; Potassium 4.0; Sodium 141    Lipid Panel    Component Value Date/Time   CHOL 163 06/10/2014 0857   TRIG 110.0 06/10/2014 0857   HDL 54.20 06/10/2014 0857   CHOLHDL 3 06/10/2014 0857   VLDL 22.0 06/10/2014 0857   LDLCALC 87 06/10/2014 0857      Wt Readings from Last 3 Encounters:  01/31/15 148 lb 12.8 oz (67.495 kg)  08/19/14 144 lb (65.318 kg)  06/10/14 142 lb (64.411 kg)         ASSESSMENT AND PLAN:  1. Essential hypertension without heart failure 2. Dyslipidemia 3. Anxiety 4. GERD 5. hypothyroidism followed by Dr. Cyndia BentBadger.  She asked us to check her thyroid functions today however since she does not think that they have been checked in a while.  Disposition: Continue same medication. We are checking fasting lab work today. Recheck in 6 months for office visit lipid panel hepatic function panel and basal metabolic panel. Try to get more regular exercise. She is still under a lot of stress because of her husband's illness with frontal temporal dementia which is worsening.   Current medicines are reviewed at length with the patient  today.  The patient does not have concerns regarding medicines.  The following changes have been made:  no change  Labs/ tests ordered today include:   Orders Placed This Encounter  Procedures  . Lipid panel  . Hepatic function panel  . Basic metabolic panel  . TSH  . T4, free  . EKG 12-Lead       Signed, Cassell Clementhomas Mykira Hofmeister, MD  01/31/2015 9:12 AM    [facility]  [place], [place], [place]  [address] Phone: [phone]; Fax: [phone]

## 2015-02-02 NOTE — Progress Notes (Signed)
Quick Note:  Please report to patient. The recent labs are stable. Continue same medication and careful diet. BS slightly high. Work harder on diet and exercise and weight loss. ______

## 2015-02-02 NOTE — Progress Notes (Signed)
Quick Note:  Please report to patient. The recent labs are stable. Continue same medication and careful diet. Thyroid is normal. ______ 

## 2015-02-03 ENCOUNTER — Telehealth: Payer: Self-pay | Admitting: Cardiology

## 2015-02-03 NOTE — Telephone Encounter (Signed)
New message      Need clarification on synthroid presc.  Please use reference number [id number]

## 2015-02-03 NOTE — Telephone Encounter (Signed)
Spoke with Tiffany at [facility] and told her synthroid prescription is for brand only. I was transferred to pharmacist and gave her same information.

## 2015-05-27 ENCOUNTER — Other Ambulatory Visit: Payer: Self-pay | Admitting: Cardiology

## 2015-09-08 ENCOUNTER — Encounter: Payer: Self-pay | Admitting: Cardiology

## 2015-09-08 ENCOUNTER — Ambulatory Visit (INDEPENDENT_AMBULATORY_CARE_PROVIDER_SITE_OTHER): Payer: Medicare Other | Admitting: Cardiology

## 2015-09-08 VITALS — BP 114/74 | HR 83 | Ht 62.0 in | Wt 142.4 lb

## 2015-09-08 DIAGNOSIS — E7800 Pure hypercholesterolemia, unspecified: Secondary | ICD-10-CM | POA: Diagnosis not present

## 2015-09-08 LAB — HEPATIC FUNCTION PANEL
ALBUMIN: 4.1 g/dL (ref 3.6–5.1)
ALT: 20 U/L (ref 6–29)
AST: 22 U/L (ref 10–35)
Alkaline Phosphatase: 62 U/L (ref 33–130)
BILIRUBIN TOTAL: 0.3 mg/dL (ref 0.2–1.2)
Bilirubin, Direct: 0.1 mg/dL (ref <=0.2)
Indirect Bilirubin: 0.2 mg/dL (ref 0.2–1.2)
Total Protein: 6.6 g/dL (ref 6.1–8.1)

## 2015-09-08 LAB — LIPID PANEL
CHOL/HDL RATIO: 2.5 ratio (ref <=5.0)
Cholesterol: 160 mg/dL (ref 125–200)
HDL: 64 mg/dL (ref >=46)
LDL CALC: 82 mg/dL (ref <130)
TRIGLYCERIDES: 69 mg/dL (ref <150)
VLDL: 14 mg/dL (ref <30)

## 2015-09-08 LAB — BASIC METABOLIC PANEL
BUN: 13 mg/dL (ref 7–25)
CALCIUM: 9.7 mg/dL (ref 8.6–10.4)
CHLORIDE: 101 mmol/L (ref 98–110)
CO2: 29 mmol/L (ref 20–31)
Creat: 0.78 mg/dL (ref 0.60–0.93)
Glucose, Bld: 123 mg/dL — ABNORMAL HIGH (ref 65–99)
Potassium: 4.1 mmol/L (ref 3.5–5.3)
SODIUM: 141 mmol/L (ref 135–146)

## 2015-09-08 MED ORDER — ROSUVASTATIN CALCIUM 5 MG PO TABS: 5.0000 mg | ORAL_TABLET | Freq: Every day | ORAL | Status: DC

## 2015-09-08 MED ORDER — VALSARTAN-HYDROCHLOROTHIAZIDE 160-12.5 MG PO TABS: 1.0000 | ORAL_TABLET | Freq: Every day | ORAL | Status: DC

## 2015-09-08 NOTE — Progress Notes (Signed)
Cardiology Office Note   Date:  09/08/2015   ID:  Kaylee Waters, DOB 1946-10-05, MRN [id number]  PCP:  Eartha InchBADGER,MICHAEL C, MD  Cardiologist: Cassell Clementhomas Jamarkis Branam MD  Chief Complaint  Patient presents with  . Hypertension      History of Present Illness: Kaylee RocksMeredith B Creppel is a 69 y.o. female who presents for scheduled 6 month follow-up visit   She has a history of dyslipidemia and essential hypertension. She also has a history of hypothyroidism and is on Synthroid. Dr. Cyndia BentBadger follows her thyroid condition.  Since we last saw her, her husband died in July unexpectedly.  He had been chronically ill with bifrontal dementia.  Since his death the patient has not really been taking care of herself.  She has been off her diet and has not been exercising.  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 She is scheduled to have cataract surgery by Dr. Burgess Estelleanner in January. Since last visit she has had no further recurrence of chest pain. She has had no new cardiac symptoms. She denies any chest pain or shortness of breath.  Past Medical History  Diagnosis Date  . HTN (hypertension)     controlled  . High cholesterol     controlled  . Hyperglycemia   . Thyroid disease     hypothyroidism  . Mitral valve prolapse   . Depression   . Anxiety   . Vocal cord paresis   . Diverticulosis   . IBS (irritable bowel syndrome)     w/ castipatia  . Dry eye     Past Surgical History  Procedure Laterality Date  . Bunionectomy  1969    LEFT FOOT   . Dilation and curettage of uterus      MISCARRIAGE  . Hysterscopy    . Cholecystectomy    . Knee arthroscopy Right 1989  . Hysteroscopy  6/10    polyp prometrium; D&C     Current Outpatient Prescriptions  Medication Sig Dispense Refill  . ALPRAZolam (XANAX) 1 MG tablet Take by mouth at bedtime as needed (sleep). Take 1-2mg  by mouth at bedtime as needed for sleep.    Marland Kitchen. aspirin 81 MG tablet Take 81 mg by mouth daily.      Marland Kitchen. buPROPion  (WELLBUTRIN XL) 300 MG 24 hr tablet Take 300 mg by mouth daily.      Marland Kitchen. esomeprazole (NEXIUM) 40 MG capsule Take 40 mg by mouth daily.      Marland Kitchen. ezetimibe (ZETIA) 10 MG tablet Take 1 tablet (10 mg total) by mouth daily. 90 tablet 3  . Glycopyrrolate (ROBINUL PO) Take 1 mg by mouth daily. 1/2-1 TABLET BY MOUTH DAILY    . lamoTRIgine (LAMICTAL) 100 MG tablet Take 100 mg by mouth 2 (two) times daily.     Marland Kitchen. levothyroxine (SYNTHROID, LEVOTHROID) 75 MCG tablet Take 1 tablet (75 mcg total) by mouth daily. BRAND ONLY 90 tablet 3  . modafinil (PROVIGIL) 200 MG tablet Take 200 mg by mouth daily.    . polyethylene glycol powder (GLYCOLAX/MIRALAX) powder Take 17 g by mouth every other day.  2  . potassium chloride SA (K-DUR,KLOR-CON) 20 MEQ tablet Take 20 mEq by mouth daily.    . ranitidine (ZANTAC) 150 MG tablet Take 150 mg by mouth at bedtime.     . rosuvastatin (CRESTOR) 5 MG tablet Take 1 tablet (5 mg total) by mouth daily. 90 tablet 3  . valsartan-hydrochlorothiazide (DIOVAN-HCT) 160-12.5 MG tablet Take 1 tablet by  mouth daily. 90 tablet 3  . [DISCONTINUED] Armodafinil (NUVIGIL) 250 MG tablet Take 250 mg by mouth daily.       No current facility-administered medications for this visit.    Allergies:   Codeine; Lipitor; Mevacor; Morphine; Norvasc; and Zocor    Social History:  The patient  reports that she has quit smoking. She has never used smokeless tobacco. She reports that she does not drink alcohol or use illicit drugs.   Family History:  The patient's family history includes Cancer in her maternal grandmother; Heart disease in her paternal grandmother; Hyperlipidemia in her mother; Hypertension in her brother and mother; Ovarian cancer in her mother; Stroke in her brother.    ROS:  Please see the history of present illness.   Otherwise, review of systems are positive for none.   All other systems are reviewed and negative.    PHYSICAL EXAM: VS:  BP 114/74 mmHg  Pulse 83  Ht  (1.575 m)   Wt 142 lb 6.4 oz (64.592 kg)  BMI 26.04 kg/m2  LMP 03/25/2009 , BMI Body mass index is 26.04 kg/(m^2). GEN: Well nourished, well developed, in no acute distress HEENT: normal Neck: no JVD, carotid bruits, or masses Cardiac: RRR; no murmurs, rubs, or gallops,no edema  Respiratory:  clear to auscultation bilaterally, normal work of breathing GI: soft, nontender, nondistended, + BS MS: no deformity or atrophy Skin: warm and dry, no rash Neuro:  Strength and sensation are intact Psych: euthymic mood, full affect   EKG:  EKG is not ordered today.    Recent Labs: 01/31/2015: ALT 25; BUN 18; Creatinine, Ser 0.81; Potassium 3.9; Sodium 138; TSH 0.96    Lipid Panel    Component Value Date/Time   CHOL 169 01/31/2015 0808   TRIG 76.0 01/31/2015 0808   HDL 59.20 01/31/2015 0808   CHOLHDL 3 01/31/2015 0808   VLDL 15.2 01/31/2015 0808   LDLCALC 95 01/31/2015 0808      Wt Readings from Last 3 Encounters:  09/08/15 142 lb 6.4 oz (64.592 kg)  01/31/15 148 lb 12.8 oz (67.495 kg)  08/19/14 144 lb (65.318 kg)         ASSESSMENT AND PLAN:  1. Essential hypertension without heart failure 2. Dyslipidemia 3. Anxiety 4. GERD 5. hypothyroidism followed by Dr. Cyndia Bent.  6.  Situational depression.  Recently a widow since July 2016   Current medicines are reviewed at length with the patient today.  The patient does not have concerns regarding medicines.  The following changes have been made:  no change  Labs/ tests ordered today include:   Orders Placed This Encounter  Procedures  . Lipid panel  . Hepatic function panel  . Basic metabolic panel    Disposition: The patient will continue medical follow-up with Dr. Cyndia Bent.  Return here on an as-needed basis.  We are checking labs today.  She has been drinking a lot of Pepsi-Cola and she expects that her blood sugar will be high.  She does not have much of an appetite for anything else.  Her weight is down 6  pounds.   Karie Schwalbe MD 09/08/2015 11:32 AM    [facility] [address] [place], [place], [place]  [address] Phone: [phone]; Fax: [phone]

## 2015-09-08 NOTE — Patient Instructions (Signed)
Medication Instructions:  Your physician recommends that you continue on your current medications as directed. Please refer to the Current Medication list given to you today.  Labwork: Lp/bmt/hfp  Testing/Procedures: none  Follow-Up: As needed   If you need a refill on your cardiac medications before your next appointment, please call your pharmacy.

## 2015-09-09 NOTE — Progress Notes (Signed)
Quick Note:  Please report to patient. The recent labs are stable. Continue same medication and careful diet. LFTs are normal. BS is higher 123. Watch sweets. ______

## 2015-09-10 ENCOUNTER — Telehealth: Payer: Self-pay | Admitting: Cardiology

## 2015-09-10 NOTE — Telephone Encounter (Signed)
Follow UP  ° °Pt returned call  °

## 2015-09-10 NOTE — Telephone Encounter (Signed)
Kaylee Waters spoke with patient regarding her labs

## 2015-10-28 ENCOUNTER — Encounter: Payer: Self-pay | Admitting: Obstetrics & Gynecology

## 2015-10-28 ENCOUNTER — Ambulatory Visit (INDEPENDENT_AMBULATORY_CARE_PROVIDER_SITE_OTHER): Payer: Medicare Other | Admitting: Obstetrics & Gynecology

## 2015-10-28 VITALS — BP 120/76 | HR 70 | Resp 18 | Ht 61.5 in | Wt 147.0 lb

## 2015-10-28 DIAGNOSIS — Z124 Encounter for screening for malignant neoplasm of cervix: Secondary | ICD-10-CM

## 2015-10-28 DIAGNOSIS — Z01419 Encounter for gynecological examination (general) (routine) without abnormal findings: Secondary | ICD-10-CM | POA: Diagnosis not present

## 2015-10-28 NOTE — Progress Notes (Signed)
70 y.o. [id number] WidowedCaucasianF here for annual exam.  Doing well.  Recent sinusitis.  Seeing PCP every six months.  Recent HbA1C was 6.4.    Husband died in 05/16/23.  Expecting second grandchild in March.  Holidays were hard.  Denies vaginal bleeding.    Having cataract surgery later this month with Dr. Burgess Estelle.  Patient's last menstrual period was 03/25/2009.          Sexually active: No.  The current method of family planning is abstinence.    Exercising: Yes.    Walk Smoker:  no  Health Maintenance: Pap:  08/03/13 Neg. History of abnormal Pap:  yes MMG:  2014-05-15 BIRADS1:neg Colonoscopy: 2015 Dr. Matthias Hughs - Normal  BMD:   03/2009 TDaP:  03/2009 Screening Labs: PCP, Hb today: PCP, Urine today: PCP   reports that she has quit smoking. She has never used smokeless tobacco. She reports that she does not drink alcohol or use illicit drugs.  Past Medical History  Diagnosis Date  . HTN (hypertension)     controlled  . High cholesterol     controlled  . Hyperglycemia   . Thyroid disease     hypothyroidism  . Mitral valve prolapse   . Depression   . Anxiety   . Vocal cord paresis   . Diverticulosis   . IBS (irritable bowel syndrome)     w/ castipatia  . Dry eye   . Cataract of both eyes     Past Surgical History  Procedure Laterality Date  . Bunionectomy  1969    LEFT FOOT   . Dilation and curettage of uterus      MISCARRIAGE  . Hysterscopy    . Cholecystectomy    . Knee arthroscopy Right 1989  . Hysteroscopy  6/10    polyp prometrium; D&C    Current Outpatient Prescriptions  Medication Sig Dispense Refill  . ALPRAZolam (XANAX) 1 MG tablet Take by mouth at bedtime as needed (sleep). Take 1-2mg  by mouth at bedtime as needed for sleep.    Marland Kitchen aspirin 81 MG tablet Take 81 mg by mouth daily.      Marland Kitchen buPROPion (WELLBUTRIN XL) 300 MG 24 hr tablet Take 300 mg by mouth daily.      . cefdinir (OMNICEF) 300 MG capsule Take 300 mg by mouth 2 (two) times daily.    Marland Kitchen esomeprazole  (NEXIUM) 40 MG capsule Take 40 mg by mouth daily.      Marland Kitchen ezetimibe (ZETIA) 10 MG tablet Take 1 tablet (10 mg total) by mouth daily. 90 tablet 3  . Glycopyrrolate (ROBINUL PO) Take 1 mg by mouth daily. 1/2-1 TABLET BY MOUTH DAILY    . lamoTRIgine (LAMICTAL) 100 MG tablet Take 100 mg by mouth 2 (two) times daily.     Marland Kitchen levothyroxine (SYNTHROID, LEVOTHROID) 75 MCG tablet Take 1 tablet (75 mcg total) by mouth daily. BRAND ONLY 90 tablet 3  . modafinil (PROVIGIL) 200 MG tablet Take 200 mg by mouth daily.    . polyethylene glycol powder (GLYCOLAX/MIRALAX) powder Take 17 g by mouth every other day.  2  . potassium chloride SA (K-DUR,KLOR-CON) 20 MEQ tablet Take 20 mEq by mouth daily.    . ranitidine (ZANTAC) 150 MG tablet Take 150 mg by mouth at bedtime.     . rosuvastatin (CRESTOR) 5 MG tablet Take 1 tablet (5 mg total) by mouth daily. 90 tablet 3  . valsartan-hydrochlorothiazide (DIOVAN-HCT) 160-12.5 MG tablet Take 1 tablet by mouth daily. 90 tablet 3  .  DUREZOL 0.05 % EMUL Reported on 10/28/2015  0  . PROLENSA 0.07 % SOLN Reported on 10/28/2015  0  . VIGAMOX 0.5 % ophthalmic solution Reported on 10/28/2015  0  . [DISCONTINUED] Armodafinil (NUVIGIL) 250 MG tablet Take 250 mg by mouth daily.       No current facility-administered medications for this visit.    Family History  Problem Relation Age of Onset  . Hyperlipidemia Mother   . Hypertension Mother   . Ovarian cancer Mother   . Hypertension Brother   . Stroke Brother   . Cancer Maternal Grandmother     uterine,ovarian  . Heart disease Paternal Grandmother     mi    ROS:  Pertinent items are noted in HPI.  Otherwise, a comprehensive ROS was negative.  Exam:   BP 120/76 mmHg  Pulse 70  Resp 18  Ht 5' 1.5" (1.562 m)  Wt 147 lb (66.679 kg)  BMI 27.33 kg/m2  LMP 03/25/2009  Weight change: +11#   Height: 5' 1.5" (156.2 cm)  Ht Readings from Last 3 Encounters:  10/28/15 5' 1.5" (1.562 m)  09/08/15 5\' 2"  (1.575 m)  01/31/15 5\' 1"  (1.549  m)    General appearance: alert, cooperative and appears stated age Head: Normocephalic, without obvious abnormality, atraumatic Neck: no adenopathy, supple, symmetrical, trachea midline and thyroid normal to inspection and palpation Lungs: clear to auscultation bilaterally Breasts: normal appearance, no masses or tenderness Heart: regular rate and rhythm Abdomen: soft, non-tender; bowel sounds normal; no masses,  no organomegaly Extremities: extremities normal, atraumatic, no cyanosis or edema Skin: Skin color, texture, turgor normal. No rashes or lesions Lymph nodes: Cervical, supraclavicular, and axillary nodes normal. No abnormal inguinal nodes palpated Neurologic: Grossly normal   Pelvic: External genitalia:  no lesions              Urethra:  normal appearing urethra with no masses, tenderness or lesions              Bartholins and Skenes: normal                 Vagina: normal appearing vagina with normal color and discharge, no lesions, atrophic changes              Cervix: no lesions              Pap taken: Yes.   Bimanual Exam:  Uterus:  normal size, contour, position, consistency, mobility, non-tender              Adnexa: normal adnexa and no mass, fullness, tenderness               Rectovaginal: Confirms               Anus:  normal sphincter tone, no lesions  Chaperone was present for exam.  A:  Well Woman with normal exam PMP, off HRT + family hx of ovarian cancer, maternal GM Hyperlipidemia Hypothyroidism GERD Mildly elevated HbA1C H/O breast cancer in her mother Recent sinusitis, on antibiotics  P: Mammogram overdue.  Pt aware.  She plans to schedule after cataract surgery. pap smear obtained today Will plan BMD with MMG this year  Labs with Dr. Cyndia BentBadger and Dr. Patty SermonsBrackbill.   Pt aware to ask about the second pneumonia vaccine return annually or prn

## 2015-10-29 LAB — IPS PAP SMEAR ONLY

## 2016-03-15 ENCOUNTER — Telehealth: Payer: Self-pay | Admitting: *Deleted

## 2016-03-15 NOTE — Telephone Encounter (Signed)
Patient notified of bone density scan results. Verbalized understanding.

## 2016-05-25 HISTORY — PX: DENTAL SURGERY: SHX609

## 2016-10-25 HISTORY — PX: CATARACT EXTRACTION W/ INTRAOCULAR LENS  IMPLANT, BILATERAL: SHX1307

## 2017-02-10 NOTE — Progress Notes (Signed)
71 y.o. [id number] WidowedCaucasianF here for annual exam.  Doing well.  Reports had issues with jaw pain after having a dental procedure.  Ended up in the ER with tachycardia and dehydration due to pain on one side of her mouth.  She did get fluids.  Better now.  Last HbA1C was 6.5.  Being followed conservatively.  Has follow up scheduled for next month.  Patient's last menstrual period was 03/25/2009.          Sexually active: No.  The current method of family planning is post menopausal status.    Exercising: Yes.    walking Smoker:  no  Health Maintenance: Pap:  10/28/15 Neg.  08/03/13 Neg  History of abnormal Pap:  Yes, years ago-cryosurgery MMG:  03/09/16 BIRADS1:neg  Colonoscopy:  2015 Normal - Dr. Matthias Hughs BMD:   03/09/16 Normal  TDaP:  03/2009 Pneumonia vaccine(s):  2017 Zostavax:   09/26/09 Hep C testing: Not done  Screening Labs: PCP   reports that she has quit smoking. She has never used smokeless tobacco. She reports that she does not drink alcohol or use drugs.  Past Medical History:  Diagnosis Date  . Anxiety   . Cataract of both eyes   . Depression   . Diverticulosis   . Dry eye   . High cholesterol    controlled  . HTN (hypertension)    controlled  . Hyperglycemia   . IBS (irritable bowel syndrome)    w/ castipatia  . Mitral valve prolapse   . Thyroid disease    hypothyroidism  . Vocal cord paresis     Past Surgical History:  Procedure Laterality Date  . BUNIONECTOMY  1969   LEFT FOOT   . CATARACT EXTRACTION W/ INTRAOCULAR LENS  IMPLANT, BILATERAL  10/2016  . CHOLECYSTECTOMY    . DENTAL SURGERY  05/2016  . DILATION AND CURETTAGE OF UTERUS     MISCARRIAGE  . HYSTEROSCOPY  6/10   polyp prometrium; D&C  . hysterscopy    . KNEE ARTHROSCOPY Right 1989    Current Outpatient Prescriptions  Medication Sig Dispense Refill  . ALPRAZolam (XANAX) 1 MG tablet Take by mouth at bedtime as needed (sleep). Take 1-2mg  by mouth at bedtime as needed for sleep.    Marland Kitchen  aspirin 81 MG tablet Take 81 mg by mouth daily.      Marland Kitchen buPROPion (WELLBUTRIN XL) 300 MG 24 hr tablet Take 300 mg by mouth daily.      Marland Kitchen esomeprazole (NEXIUM) 20 MG capsule Take 20 mg by mouth daily at 12 noon.    . ezetimibe (ZETIA) 10 MG tablet Take 1 tablet (10 mg total) by mouth daily. 90 tablet 3  . lamoTRIgine (LAMICTAL) 100 MG tablet Take 100 mg by mouth 2 (two) times daily.     Marland Kitchen levothyroxine (SYNTHROID, LEVOTHROID) 75 MCG tablet Take 1 tablet (75 mcg total) by mouth daily. BRAND ONLY 90 tablet 3  . modafinil (PROVIGIL) 200 MG tablet Take 200 mg by mouth daily.    . polyethylene glycol powder (GLYCOLAX/MIRALAX) powder Take 17 g by mouth every other day.  2  . potassium chloride SA (K-DUR,KLOR-CON) 20 MEQ tablet Take 20 mEq by mouth daily.    . ranitidine (ZANTAC) 150 MG tablet Take 150 mg by mouth at bedtime.     . rosuvastatin (CRESTOR) 5 MG tablet Take 1 tablet (5 mg total) by mouth daily. 90 tablet 3  . valsartan-hydrochlorothiazide (DIOVAN-HCT) 160-12.5 MG tablet Take 1 tablet by mouth daily.  90 tablet 3   No current facility-administered medications for this visit.     Family History  Problem Relation Age of Onset  . Hyperlipidemia Mother   . Hypertension Mother   . Ovarian cancer Mother   . Hypertension Brother   . Stroke Brother   . Cancer Maternal Grandmother     uterine,ovarian  . Heart disease Paternal Grandmother     mi    ROS:  Pertinent items are noted in HPI.  Otherwise, a comprehensive ROS was negative.  Exam:   BP 126/86 (BP Location: Right Arm, Patient Position: Sitting, Cuff Size: Normal)   Pulse 84   Resp 16   Ht 5' 1.25" (1.556 m)   Wt 146 lb (66.2 kg)   LMP 03/25/2009   BMI 27.36 kg/m     Height: 5' 1.25" (155.6 cm)  Ht Readings from Last 3 Encounters:  02/11/17 5' 1.25" (1.556 m)  10/28/15 5' 1.5" (1.562 m)  09/08/15  (1.575 m)    General appearance: alert, cooperative and appears stated age Head: Normocephalic, without obvious  abnormality, atraumatic Neck: no adenopathy, supple, symmetrical, trachea midline and thyroid normal to inspection and palpation Lungs: clear to auscultation bilaterally Breasts: normal appearance, no masses or tenderness Heart: regular rate and rhythm Abdomen: soft, non-tender; bowel sounds normal; no masses,  no organomegaly Extremities: extremities normal, atraumatic, no cyanosis or edema Skin: Skin color, texture, turgor normal. No rashes or lesions Lymph nodes: Cervical, supraclavicular, and axillary nodes normal. No abnormal inguinal nodes palpated Neurologic: Grossly normal   Pelvic: External genitalia:  no lesions              Urethra:  normal appearing urethra with no masses, tenderness or lesions              Bartholins and Skenes: normal                 Vagina: normal appearing vagina with normal color and discharge, no lesions              Cervix: no lesions              Pap taken: Yes.   Bimanual Exam:  Uterus:  normal size, contour, position, consistency, mobility, non-tender              Adnexa: normal adnexa and no mass, fullness, tenderness               Rectovaginal: Confirms               Anus:  normal sphincter tone, no lesions  Chaperone was present for exam.  A:  Well Woman with normal exam PMP, off HRT Family hx of ovarian cancer in maternal grandmother Elevated lipids Hypothyroidism GERD Pre-diabetes Family hx of breast cancer in mother  P:   Mammogram guidelines reviewed pap smear obtained today per pt request. Lab with UTD as are vaccines return annually or prn

## 2017-02-11 ENCOUNTER — Encounter: Payer: Self-pay | Admitting: Obstetrics & Gynecology

## 2017-02-11 ENCOUNTER — Other Ambulatory Visit (HOSPITAL_COMMUNITY)
Admission: RE | Admit: 2017-02-11 | Discharge: 2017-02-11 | Disposition: A | Discharge status: Home / Self Care | Payer: Medicare Other | Source: Ambulatory Visit | Attending: Obstetrics & Gynecology | Admitting: Obstetrics & Gynecology

## 2017-02-11 ENCOUNTER — Ambulatory Visit (INDEPENDENT_AMBULATORY_CARE_PROVIDER_SITE_OTHER): Payer: Medicare Other | Admitting: Obstetrics & Gynecology

## 2017-02-11 VITALS — BP 126/86 | HR 84 | Resp 16 | Ht 61.25 in | Wt 146.0 lb

## 2017-02-11 DIAGNOSIS — Z124 Encounter for screening for malignant neoplasm of cervix: Secondary | ICD-10-CM | POA: Insufficient documentation

## 2017-02-11 DIAGNOSIS — Z01419 Encounter for gynecological examination (general) (routine) without abnormal findings: Secondary | ICD-10-CM | POA: Diagnosis not present

## 2017-02-14 LAB — CYTOLOGY - PAP: Diagnosis: NEGATIVE

## 2017-02-22 DIAGNOSIS — S82201A Unspecified fracture of shaft of right tibia, initial encounter for closed fracture: Secondary | ICD-10-CM

## 2017-02-22 HISTORY — DX: Unspecified fracture of shaft of right tibia, initial encounter for closed fracture: S82.201A

## 2018-04-28 ENCOUNTER — Ambulatory Visit: Payer: Medicare Other | Admitting: Obstetrics & Gynecology

## 2018-04-28 ENCOUNTER — Encounter

## 2018-05-01 ENCOUNTER — Other Ambulatory Visit: Payer: Self-pay

## 2018-05-01 ENCOUNTER — Other Ambulatory Visit (HOSPITAL_COMMUNITY)
Admission: RE | Admit: 2018-05-01 | Discharge: 2018-05-01 | Disposition: A | Discharge status: Home / Self Care | Payer: Medicare Other | Source: Ambulatory Visit | Attending: Obstetrics & Gynecology | Admitting: Obstetrics & Gynecology

## 2018-05-01 ENCOUNTER — Ambulatory Visit (INDEPENDENT_AMBULATORY_CARE_PROVIDER_SITE_OTHER): Payer: Medicare Other | Admitting: Obstetrics & Gynecology

## 2018-05-01 ENCOUNTER — Encounter: Payer: Self-pay | Admitting: Obstetrics & Gynecology

## 2018-05-01 VITALS — BP 122/70 | HR 76 | Resp 14 | Ht 61.0 in | Wt 148.2 lb

## 2018-05-01 DIAGNOSIS — Z01419 Encounter for gynecological examination (general) (routine) without abnormal findings: Secondary | ICD-10-CM

## 2018-05-01 DIAGNOSIS — Z124 Encounter for screening for malignant neoplasm of cervix: Secondary | ICD-10-CM

## 2018-05-01 NOTE — Progress Notes (Signed)
72 y.o. [id number] WidowedCaucasianF here for annual exam.  Frustrated with right tibial stress fracture that she thinks came from dog sitting her daughter's 60 pound dog.  Feels like she twisted her right knee yesterday.  Did have a repeat bone density with worse T score -1.1.  Denies vaginal bleeding.    Patient's last menstrual period was 03/25/2009.          Sexually active: No.  The current method of family planning is post menopausal status.    Exercising: Yes.    walk Smoker:  no  Health Maintenance: Pap:  02/11/17 neg   10/28/15 neg  History of abnormal Pap:  yes MMG: 06/09/17 Called [facility], faxing report. Has appt 06/09/18 Colonoscopy:  2015 normal  BMD:   03/14/18 Osteopenia - Care everywhere  TDaP:  03/2009 Pneumonia vaccine(s):  2017 Shingrix:   Did complete Zostavax Hep C testing: No Screening Labs: PCP   reports that she has quit smoking. She has never used smokeless tobacco. She reports that she does not drink alcohol or use drugs.  Past Medical History:  Diagnosis Date  . Anxiety   . Cataract of both eyes   . Closed right tibial fracture 02/2017  . Depression   . Diverticulosis   . Dry eye   . High cholesterol    controlled  . HTN (hypertension)    controlled  . Hyperglycemia   . IBS (irritable bowel syndrome)    w/ castipatia  . Mitral valve prolapse   . Thyroid disease    hypothyroidism  . Vocal cord paresis     Past Surgical History:  Procedure Laterality Date  . BUNIONECTOMY  1969   LEFT FOOT   . CATARACT EXTRACTION W/ INTRAOCULAR LENS  IMPLANT, BILATERAL  10/2016  . CHOLECYSTECTOMY    . DENTAL SURGERY  05/2016  . DILATION AND CURETTAGE OF UTERUS     MISCARRIAGE  . HYSTEROSCOPY  6/10   polyp prometrium; D&C  . hysterscopy    . KNEE ARTHROSCOPY Right 1989    Current Outpatient Medications  Medication Sig Dispense Refill  . ALPRAZolam (XANAX) 1 MG tablet Take by mouth at bedtime as needed (sleep). Take 1-2mg  by mouth at bedtime as needed for  sleep.    Marland Kitchen amLODipine (NORVASC) 10 MG tablet Take 0.5 tablets by mouth daily.    Marland Kitchen aspirin 81 MG tablet Take 81 mg by mouth daily.      Marland Kitchen buPROPion (WELLBUTRIN XL) 300 MG 24 hr tablet Take 300 mg by mouth daily.      . Cholecalciferol (VITAMIN D3) 2000 units capsule Take 4,000 Units by mouth daily.    . cloNIDine (CATAPRES - DOSED IN MG/24 HR) 0.2 mg/24hr patch Place onto the skin once a week.    . esomeprazole (NEXIUM) 20 MG capsule Take 20 mg by mouth daily at 12 noon.    . lamoTRIgine (LAMICTAL) 100 MG tablet Take 100 mg by mouth 2 (two) times daily.     Marland Kitchen levothyroxine (SYNTHROID, LEVOTHROID) 75 MCG tablet Take 1 tablet (75 mcg total) by mouth daily. BRAND ONLY 90 tablet 3  . modafinil (PROVIGIL) 200 MG tablet Take 200 mg by mouth daily.    . polyethylene glycol powder (GLYCOLAX/MIRALAX) powder Take 17 g by mouth every other day.  2  . potassium chloride SA (K-DUR,KLOR-CON) 20 MEQ tablet Take 20 mEq by mouth daily.    . ranitidine (ZANTAC) 150 MG tablet Take 150 mg by mouth at bedtime.     Marland Kitchen  rosuvastatin (CRESTOR) 5 MG tablet Take 1 tablet (5 mg total) by mouth daily. 90 tablet 3  . valsartan-hydrochlorothiazide (DIOVAN HCT) 320-12.5 MG tablet Take 1 tablet by mouth daily.     No current facility-administered medications for this visit.     Family History  Problem Relation Age of Onset  . Hyperlipidemia Mother   . Hypertension Mother   . Ovarian cancer Mother   . Hypertension Brother   . Stroke Brother   . Cancer Maternal Grandmother        uterine,ovarian  . Heart disease Paternal Grandmother        mi    Review of Systems  Musculoskeletal: Positive for joint pain and myalgias.  Skin: Positive for itching.  Psychiatric/Behavioral: Positive for depression. The patient is nervous/anxious.   All other systems reviewed and are negative.   Exam:   BP 122/70 (BP Location: Right Arm, Patient Position: Sitting, Cuff Size: Normal)   Pulse 76   Resp 14   Ht 5\' 1"  (1.549 m)    Wt 148 lb 3.2 oz (67.2 kg)   LMP 03/25/2009   BMI 28.00 kg/m     Height: 5\' 1"  (154.9 cm)  Ht Readings from Last 3 Encounters:  05/01/18 5\' 1"  (1.549 m)  02/11/17 5' 1.25" (1.556 m)  10/28/15 5' 1.5" (1.562 m)    General appearance: alert, cooperative and appears stated age Head: Normocephalic, without obvious abnormality, atraumatic Neck: no adenopathy, supple, symmetrical, trachea midline and thyroid normal to inspection and palpation Lungs: clear to auscultation bilaterally Breasts: normal appearance, no masses or tenderness Heart: regular rate and rhythm Abdomen: soft, non-tender; bowel sounds normal; no masses,  no organomegaly Extremities: extremities normal, atraumatic, no cyanosis or edema Skin: Skin color, texture, turgor normal. No rashes or lesions Lymph nodes: Cervical, supraclavicular, and axillary nodes normal. No abnormal inguinal nodes palpated Neurologic: Grossly normal   Pelvic: External genitalia:  no lesions              Urethra:  normal appearing urethra with no masses, tenderness or lesions              Bartholins and Skenes: normal                 Vagina: normal appearing vagina with normal color and discharge, no lesions              Cervix: no lesions              Pap taken: Yes.   Bimanual Exam:  Uterus:  normal size, contour, position, consistency, mobility, non-tender              Adnexa: normal adnexa and no mass, fullness, tenderness               Rectovaginal: Confirms               Anus:  normal sphincter tone, no lesions  Chaperone was present for exam.  A:  Well Woman with normal exam PMP, no HRT Family hx of breast and ovarian cancer in MGM H/O elevated lipids Hypothyroidism GERD Prediabetes  P:   Mammogram guidelines reviewed Pap obtained today Lab UTD with Dr. Cyndia BentBadger Shingles vaccine discussed with pt  Return annually or prn

## 2018-05-02 LAB — CYTOLOGY - PAP: Diagnosis: NEGATIVE

## 2018-05-09 ENCOUNTER — Encounter: Payer: Self-pay | Admitting: Obstetrics & Gynecology

## 2018-05-16 ENCOUNTER — Ambulatory Visit: Payer: Medicare Other | Admitting: Obstetrics & Gynecology

## 2018-06-23 ENCOUNTER — Encounter: Payer: Self-pay | Admitting: Obstetrics & Gynecology

## 2019-07-03 ENCOUNTER — Encounter: Payer: Self-pay | Admitting: Obstetrics & Gynecology

## 2019-08-14 ENCOUNTER — Other Ambulatory Visit: Payer: Self-pay

## 2019-08-17 ENCOUNTER — Encounter: Payer: Self-pay | Admitting: Obstetrics & Gynecology

## 2019-08-17 ENCOUNTER — Other Ambulatory Visit: Payer: Self-pay

## 2019-08-17 ENCOUNTER — Other Ambulatory Visit (HOSPITAL_COMMUNITY)
Admission: RE | Admit: 2019-08-17 | Discharge: 2019-08-17 | Disposition: A | Discharge status: Home / Self Care | Payer: Medicare Other | Source: Ambulatory Visit | Attending: Obstetrics & Gynecology | Admitting: Obstetrics & Gynecology

## 2019-08-17 ENCOUNTER — Ambulatory Visit (INDEPENDENT_AMBULATORY_CARE_PROVIDER_SITE_OTHER): Payer: Medicare Other | Admitting: Obstetrics & Gynecology

## 2019-08-17 VITALS — BP 138/86 | HR 76 | Temp 97.0°F | Ht 61.0 in | Wt 139.0 lb

## 2019-08-17 DIAGNOSIS — Z124 Encounter for screening for malignant neoplasm of cervix: Secondary | ICD-10-CM | POA: Insufficient documentation

## 2019-08-17 DIAGNOSIS — Z01419 Encounter for gynecological examination (general) (routine) without abnormal findings: Secondary | ICD-10-CM | POA: Diagnosis not present

## 2019-08-17 NOTE — Progress Notes (Signed)
73 y.o. [id number] Widowed White or Caucasian female here for annual exam.  Daughter had stage 1 renal stage carcinoma.  She was in [place] and had partial nephrectomy.  She is going well.  Denies vaginal bleeding.    Having some blood pressure issues.  She is also having some headaches that are associated with this.  Dr. Melford Aase is managing this.  Patient's last menstrual period was 03/25/2009.          Sexually active: No.  The current method of family planning is post menopausal status.    Exercising: Yes.    walking Smoker:  no  Health Maintenance: Pap:  05/01/18 Neg   02/11/17 Neg  History of abnormal Pap:  yes MMG:  07/03/19 BIRADS1:neg.  Colonoscopy:  2015 normal  BMD:   03/14/18 Osteopenia  TDaP:  2020 Pneumonia vaccine(s):  2018 Shingrix:   Zostavax Flu vacc: 06/26/19 Hep C testing: PCP Screening Labs: PCP   reports that she has quit smoking. She has never used smokeless tobacco. She reports that she does not drink alcohol or use drugs.  Past Medical History:  Diagnosis Date  . Anxiety   . Cataract of both eyes   . Closed right tibial fracture 02/2017  . Depression   . Diverticulosis   . Dry eye   . Headache   . High cholesterol    controlled  . HTN (hypertension)    controlled  . Hyperglycemia   . IBS (irritable bowel syndrome)    w/ castipatia  . Mitral valve prolapse   . Thyroid disease    hypothyroidism  . Vocal cord paresis     Past Surgical History:  Procedure Laterality Date  . BUNIONECTOMY  1969   LEFT FOOT   . CATARACT EXTRACTION W/ INTRAOCULAR LENS  IMPLANT, BILATERAL  10/2016  . CHOLECYSTECTOMY    . DENTAL SURGERY  05/2016  . DILATION AND CURETTAGE OF UTERUS     MISCARRIAGE  . HYSTEROSCOPY  6/10   polyp prometrium; D&C  . hysterscopy    . KNEE ARTHROSCOPY Right 1989    Current Outpatient Medications  Medication Sig Dispense Refill  . ALPRAZolam (XANAX) 1 MG tablet Take by mouth at bedtime as needed (sleep). Take 1-2mg  by mouth at bedtime as  needed for sleep.    Marland Kitchen amLODipine (NORVASC) 10 MG tablet Take 0.5 tablets by mouth daily.    Marland Kitchen aspirin 81 MG tablet Take 81 mg by mouth daily.      Marland Kitchen buPROPion (WELLBUTRIN XL) 300 MG 24 hr tablet Take 300 mg by mouth daily.      . Cholecalciferol (VITAMIN D3) 2000 units capsule Take 4,000 Units by mouth daily.    . clobetasol cream (TEMOVATE) 0.05 % Apply topically.    . cloNIDine (CATAPRES - DOSED IN MG/24 HR) 0.2 mg/24hr patch Place onto the skin once a week.    . esomeprazole (NEXIUM) 20 MG capsule Take 20 mg by mouth daily at 12 noon.    . lamoTRIgine (LAMICTAL) 100 MG tablet Take 100 mg by mouth 2 (two) times daily.     Marland Kitchen levothyroxine (SYNTHROID, LEVOTHROID) 75 MCG tablet Take 1 tablet (75 mcg total) by mouth daily. BRAND ONLY 90 tablet 3  . modafinil (PROVIGIL) 200 MG tablet Take 200 mg by mouth daily.    . polyethylene glycol powder (GLYCOLAX/MIRALAX) powder Take 17 g by mouth every other day.  2  . potassium chloride SA (K-DUR,KLOR-CON) 20 MEQ tablet Take 20 mEq by mouth daily.    Marland Kitchen  rosuvastatin (CRESTOR) 5 MG tablet Take 1 tablet (5 mg total) by mouth daily. 90 tablet 3  . valsartan-hydrochlorothiazide (DIOVAN-HCT) 160-12.5 MG tablet Take 1 tablet by mouth daily.     No current facility-administered medications for this visit.     Family History  Problem Relation Age of Onset  . Hyperlipidemia Mother   . Hypertension Mother   . Ovarian cancer Mother   . Hypertension Brother   . Stroke Brother   . Cancer Maternal Grandmother        uterine,ovarian  . Heart attack Paternal Grandmother     Review of Systems  All other systems reviewed and are negative.   Exam:   BP 138/86   Pulse 76   Temp (!) 97 F (36.1 C) (Temporal)   Ht 5\' 1"  (1.549 m)   Wt 139 lb (63 kg)   LMP 03/25/2009   BMI 26.26 kg/m   Height:   Height: 5\' 1"  (154.9 cm)  Ht Readings from Last 3 Encounters:  08/17/19 5\' 1"  (1.549 m)  05/01/18 5\' 1"  (1.549 m)  02/11/17 5' 1.25" (1.556 m)    General  appearance: alert, cooperative and appears stated age Head: Normocephalic, without obvious abnormality, atraumatic Neck: no adenopathy, supple, symmetrical, trachea midline and thyroid normal to inspection and palpation Lungs: clear to auscultation bilaterally Breasts: normal appearance, no masses or tenderness Heart: regular rate and rhythm Abdomen: soft, non-tender; bowel sounds normal; no masses,  no organomegaly Extremities: extremities normal, atraumatic, no cyanosis or edema Skin: Skin color, texture, turgor normal. No rashes or lesions Lymph nodes: Cervical, supraclavicular, and axillary nodes normal. No abnormal inguinal nodes palpated Neurologic: Grossly normal   Pelvic: External genitalia:  no lesions              Urethra:  normal appearing urethra with no masses, tenderness or lesions              Bartholins and Skenes: normal                 Vagina: normal appearing vagina with normal color and discharge, no lesions              Cervix: no lesions              Pap taken: Yes.   Bimanual Exam:  Uterus:  normal size, contour, position, consistency, mobility, non-tender              Adnexa: normal adnexa and no mass, fullness, tenderness               Rectovaginal: Confirms               Anus:  normal sphincter tone, no lesions  Chaperone was present for exam.  A:  Well Woman with normal exam PMP, no HRT Family hx of breast and ovarian cancer in [place] H/o elevated lipids Hypothyroidism GERD Prediabetes  P:   Mammogram guidelines reviewed.  Doing yearly MMG. pap smear  Lab work done recently with Dr. Shingrix vaccination discussed.  Declines for now.   return annually or prn

## 2019-08-20 LAB — CYTOLOGY - PAP: Diagnosis: NEGATIVE

## 2019-11-22 ENCOUNTER — Ambulatory Visit: Payer: Medicare Other

## 2019-11-30 ENCOUNTER — Ambulatory Visit: Payer: Medicare Other

## 2019-12-03 ENCOUNTER — Ambulatory Visit: Payer: Medicare Other

## 2020-08-12 ENCOUNTER — Encounter: Payer: Self-pay | Admitting: Obstetrics & Gynecology

## 2020-11-07 ENCOUNTER — Other Ambulatory Visit: Payer: Self-pay

## 2020-11-07 ENCOUNTER — Ambulatory Visit (INDEPENDENT_AMBULATORY_CARE_PROVIDER_SITE_OTHER): Payer: Medicare Other | Admitting: Obstetrics & Gynecology

## 2020-11-07 ENCOUNTER — Encounter: Payer: Self-pay | Admitting: Obstetrics & Gynecology

## 2020-11-07 VITALS — BP 124/75 | HR 77 | Wt 145.0 lb

## 2020-11-07 DIAGNOSIS — Z01419 Encounter for gynecological examination (general) (routine) without abnormal findings: Secondary | ICD-10-CM

## 2020-11-07 DIAGNOSIS — M8580 Other specified disorders of bone density and structure, unspecified site: Secondary | ICD-10-CM | POA: Diagnosis not present

## 2020-11-07 DIAGNOSIS — E039 Hypothyroidism, unspecified: Secondary | ICD-10-CM

## 2020-11-07 DIAGNOSIS — N905 Atrophy of vulva: Secondary | ICD-10-CM

## 2020-11-07 DIAGNOSIS — K589 Irritable bowel syndrome without diarrhea: Secondary | ICD-10-CM | POA: Insufficient documentation

## 2020-11-07 MED ORDER — NONFORMULARY OR COMPOUNDED ITEM: 3 refills | Status: DC

## 2020-11-07 NOTE — Progress Notes (Signed)
75 y.o. [id number] Widowed White or Caucasian female here for breast and pelvic exam.  I am also following her for her bone density.  She did have a stress fracture.  Last BMD was at [facility] in 02/2018 after having the stress fracture.  This showed mild osteopenia.    Denies vaginal bleeding.  Having some vulvar irritation.  Saw a little blood with wiping 4 weeks ago.    She did see her children over the holidays.  Everyone was very careful.    Patient's last menstrual period was 03/25/2009.          Sexually active: No.  H/O STD:  no  Health Maintenance: PCP:  Dr. Cyndia Bent.  Last wellness appt was 09/04/2020.  Did blood work at that appt:  02/26/2020.  hbA1C was 7.0. Vaccines are up to date:  Yes, except she has not had the shingrix vaccination Colonoscopy:  2015 MMG:  07/08/2020 BMD:  2019 Last pap smear:  2020.   H/o abnormal pap smear:  Remote hx    reports that she has quit smoking. She has never used smokeless tobacco. She reports that she does not drink alcohol and does not use drugs.  Past Medical History:  Diagnosis Date  . Anxiety   . Cataract of both eyes   . Closed right tibial fracture 02/2017  . Depression   . Diverticulosis   . Dry eye   . Headache   . High cholesterol    controlled  . HTN (hypertension)    controlled  . Hyperglycemia   . IBS (irritable bowel syndrome)    w/ castipatia  . Mitral valve prolapse   . Thyroid disease    hypothyroidism  . Vocal cord paresis     Past Surgical History:  Procedure Laterality Date  . BUNIONECTOMY  1969   LEFT FOOT   . CATARACT EXTRACTION W/ INTRAOCULAR LENS  IMPLANT, BILATERAL  10/2016  . CHOLECYSTECTOMY    . DENTAL SURGERY  05/2016  . DILATION AND CURETTAGE OF UTERUS     MISCARRIAGE  . HYSTEROSCOPY  6/10   polyp prometrium; D&C  . hysterscopy    . KNEE ARTHROSCOPY Right 1989    Current Outpatient Medications  Medication Sig Dispense Refill  . ALPRAZolam (XANAX) 1 MG tablet Take by mouth at bedtime as needed  (sleep). Take 1-2mg  by mouth at bedtime as needed for sleep.    Marland Kitchen amLODipine (NORVASC) 10 MG tablet Take 0.5 tablets by mouth daily.    . Ascorbic Acid (VITAMIN C) 1000 MG tablet Take 1,000 mg by mouth daily.    Marland Kitchen aspirin 81 MG tablet Take 81 mg by mouth daily.    Marland Kitchen buPROPion (WELLBUTRIN XL) 300 MG 24 hr tablet Take 300 mg by mouth daily.    . Cholecalciferol (VITAMIN D3) 2000 units capsule Take 4,000 Units by mouth daily.    . cloNIDine (CATAPRES - DOSED IN MG/24 HR) 0.2 mg/24hr patch Place onto the skin once a week.    . esomeprazole (NEXIUM) 20 MG capsule Take 20 mg by mouth daily at 12 noon.    . lamoTRIgine (LAMICTAL) 100 MG tablet Take 100 mg by mouth 2 (two) times daily.    Marland Kitchen levothyroxine (SYNTHROID, LEVOTHROID) 75 MCG tablet Take 1 tablet (75 mcg total) by mouth daily. BRAND ONLY 90 tablet 3  . modafinil (PROVIGIL) 200 MG tablet Take 200 mg by mouth daily. Pt is taking 25 mg    . potassium chloride SA (K-DUR,KLOR-CON) 20 MEQ tablet  Take 20 mEq by mouth daily.    . rosuvastatin (CRESTOR) 5 MG tablet Take 1 tablet (5 mg total) by mouth daily. 90 tablet 3  . valsartan-hydrochlorothiazide (DIOVAN-HCT) 160-12.5 MG tablet Take 1 tablet by mouth daily.    . clobetasol cream (TEMOVATE) 0.05 % Apply topically. (Patient not taking: Reported on 11/07/2020)    . polyethylene glycol powder (GLYCOLAX/MIRALAX) powder Take 17 g by mouth every other day. (Patient not taking: Reported on 11/07/2020)  2   No current facility-administered medications for this visit.    Family History  Problem Relation Age of Onset  . Hyperlipidemia Mother   . Hypertension Mother   . Ovarian cancer Mother   . Hypertension Brother   . Stroke Brother   . Cancer Maternal Grandmother        uterine,ovarian  . Heart attack Paternal Grandmother     Review of Systems  Exam:   BP 124/75   Pulse 77   Wt 145 lb (65.8 kg)   LMP 03/25/2009   BMI 27.40 kg/m      General appearance: alert, cooperative and appears  stated age Breasts: normal appearance, no masses or tenderness Abdomen: soft, non-tender; bowel sounds normal; no masses,  no organomegaly Lymph nodes: Cervical, supraclavicular, and axillary nodes normal.  No abnormal inguinal nodes palpated Neurologic: Grossly normal  Pelvic: External genitalia:  no lesions, tissue is just thin              Urethra:  normal appearing urethra with no masses, tenderness or lesions              Bartholins and Skenes: normal                 Vagina: normal appearing vagina with normal color and discharge, no lesions              Cervix: no lesions              Pap taken: No. Bimanual Exam:  Uterus:  normal size, contour, position, consistency, mobility, non-tender              Adnexa: normal adnexa and no mass, fullness, tenderness               Rectovaginal: Confirms               Anus:  normal sphincter tone, no lesions  Chaperone, Lorelle Gibbs, CMA, was present for exam.  Assessment/Plan 1. Encntr for gyn exam (general) (routine) w/o abn findings - last Pap 2020.  Not indicated today - MMG 07/08/2020 - colonoscopy 2015.  Pt is going to call to double check follow up but I think given no hx of polyps and no family hx, this is a 10 year follow up - vaccines reviewed - Lab work done with Dr. Cyndia Bent 02/2020 and is in Care Everywhere  2. Acquired hypothyroidism  3. Osteopenia, unspecified location - BMD follow up recommended in about 2 years  4. Vulvar atrophy - NONFORMULARY OR COMPOUNDED ITEM; Vitamin E cream 200u/ml.  One ml topically and/or internally three times weekly.  3 month supply/4RF.  Dispense: 1 each; Refill: 3 - Pt knows to call with any new bleeding/problems/concerns.   25 minutes of total time was spent for this patient encounter, including preparation, face-to-face counseling with the patient and coordination of care, and documentation of the encounter.

## 2021-02-22 DIAGNOSIS — E119 Type 2 diabetes mellitus without complications: Secondary | ICD-10-CM | POA: Insufficient documentation

## 2021-02-23 ENCOUNTER — Ambulatory Visit (INDEPENDENT_AMBULATORY_CARE_PROVIDER_SITE_OTHER): Payer: Medicare Other | Admitting: Obstetrics & Gynecology

## 2021-02-23 ENCOUNTER — Other Ambulatory Visit: Payer: Self-pay

## 2021-02-23 ENCOUNTER — Encounter (HOSPITAL_BASED_OUTPATIENT_CLINIC_OR_DEPARTMENT_OTHER): Payer: Self-pay | Admitting: Obstetrics & Gynecology

## 2021-02-23 VITALS — BP 148/86 | HR 77 | Ht 62.0 in | Wt 142.2 lb

## 2021-02-23 DIAGNOSIS — R319 Hematuria, unspecified: Secondary | ICD-10-CM

## 2021-02-23 DIAGNOSIS — N9089 Other specified noninflammatory disorders of vulva and perineum: Secondary | ICD-10-CM

## 2021-02-23 DIAGNOSIS — R300 Dysuria: Secondary | ICD-10-CM

## 2021-02-23 LAB — POCT URINALYSIS DIPSTICK
Bilirubin, UA: NEGATIVE
Glucose, UA: POSITIVE — AB
Ketones, UA: NEGATIVE
Leukocytes, UA: NEGATIVE
Nitrite, UA: NEGATIVE
Protein, UA: NEGATIVE
Spec Grav, UA: 1.01 (ref 1.010–1.025)
Urobilinogen, UA: 0.2 E.U./dL
pH, UA: 5 (ref 5.0–8.0)

## 2021-02-23 MED ORDER — SULFAMETHOXAZOLE-TRIMETHOPRIM 800-160 MG PO TABS: 1.0000 | ORAL_TABLET | Freq: Two times a day (BID) | ORAL | 0 refills | Status: DC

## 2021-02-23 MED ORDER — TERCONAZOLE 0.4 % VA CREA: 1.0000 | TOPICAL_CREAM | Freq: Every day | VAGINAL | 0 refills | Status: DC

## 2021-02-24 ENCOUNTER — Encounter (HOSPITAL_BASED_OUTPATIENT_CLINIC_OR_DEPARTMENT_OTHER): Payer: Self-pay

## 2021-02-24 ENCOUNTER — Ambulatory Visit (HOSPITAL_BASED_OUTPATIENT_CLINIC_OR_DEPARTMENT_OTHER): Payer: Medicare Other | Admitting: Obstetrics & Gynecology

## 2021-02-24 NOTE — Progress Notes (Signed)
GYNECOLOGY  VISIT  CC:   Burning and irritation  HPI: 75 y.o. [id number] Widowed White or Caucasian female here for complaint of burning with urination and skin irritation.  Reports she doesn't really have discharge but her vulvar skin feels hot and irritated.  Also, she is having burning with urination--almost like the skin is burning.  Has diabetes.  Has switched to Jardiance since I saw her last.  D/w pt risks of yeast vaginitis and UTIs due to increase glucose clearance in urine.  Denies vaginal bleeding.  No frank blood in urine.  Dip today with trace RBCs.  Denies fever or pelvic pain or vaginal bleeding.  Separately, pt does report some intermittent chest pain. Had coronary CT with score of 6.5.  Also, had stress test.  Will be seeing cardiologist next month.  GYNECOLOGIC HISTORY: Patient's last menstrual period was 03/25/2009. Contraception: PMP Menopausal hormone therapy: none  Patient Active Problem List   Diagnosis Date Noted  . IBS (irritable bowel syndrome)   . Chest pain at rest 08/24/2013  . Benign hypertensive heart disease without heart failure 12/31/2011  . Mood disorder (HCC) 09/10/2011  . Dyslipidemia 09/03/2011  . HTN (hypertension)   . Hyperglycemia   . MUSCLE PAIN 11/21/2009  . Hypothyroid 09/28/2009  . VITAMIN D DEFICIENCY 09/26/2009  . HYPERCALCEMIA 09/26/2009  . OTHER SYMPTOMS INVOLVING HEAD AND NECK 09/26/2009  . Other and unspecified hyperlipidemia 10/06/2007  . GASTROESOPHAGEAL REFLUX DISEASE, CHRONIC 10/06/2007    Past Medical History:  Diagnosis Date  . Anxiety   . Cataract of both eyes   . Closed right tibial fracture 02/2017  . Depression   . Diverticulosis   . Dry eye   . Headache   . High cholesterol    controlled  . HTN (hypertension)    controlled  . Hyperglycemia   . IBS (irritable bowel syndrome)    w/ castipatia  . IBS (irritable bowel syndrome)   . Mitral valve prolapse   . Thyroid disease    hypothyroidism  . Vocal cord  paresis     Past Surgical History:  Procedure Laterality Date  . BUNIONECTOMY  1969   LEFT FOOT   . CATARACT EXTRACTION W/ INTRAOCULAR LENS  IMPLANT, BILATERAL  10/2016  . CHOLECYSTECTOMY    . DENTAL SURGERY  05/2016  . DILATION AND CURETTAGE OF UTERUS     MISCARRIAGE  . HYSTEROSCOPY  6/10   polyp prometrium; D&C  . KNEE ARTHROSCOPY Right 1989    MEDS:   Current Outpatient Medications on File Prior to Visit  Medication Sig Dispense Refill  . ALPRAZolam (XANAX) 1 MG tablet Take by mouth at bedtime as needed (sleep). Take 1-2mg  by mouth at bedtime as needed for sleep.    . Ascorbic Acid (VITAMIN C) 1000 MG tablet Take 1,000 mg by mouth daily.    Marland Kitchen aspirin 81 MG tablet Take 81 mg by mouth daily.    Marland Kitchen buPROPion (WELLBUTRIN XL) 300 MG 24 hr tablet Take 300 mg by mouth daily.    . Cholecalciferol (VITAMIN D3) 2000 units capsule Take 4,000 Units by mouth daily.    . cloNIDine (CATAPRES - DOSED IN MG/24 HR) 0.2 mg/24hr patch Place onto the skin once a week.    . empagliflozin (JARDIANCE) 10 MG TABS tablet Take 10 mg by mouth daily.    Marland Kitchen esomeprazole (NEXIUM) 20 MG capsule Take 20 mg by mouth daily at 12 noon.    . lamoTRIgine (LAMICTAL) 100 MG tablet Take 100 mg  by mouth 2 (two) times daily.    Marland Kitchen levothyroxine (SYNTHROID, LEVOTHROID) 75 MCG tablet Take 1 tablet (75 mcg total) by mouth daily. BRAND ONLY 90 tablet 3  . potassium chloride SA (K-DUR,KLOR-CON) 20 MEQ tablet Take 20 mEq by mouth daily.    . rosuvastatin (CRESTOR) 5 MG tablet Take 1 tablet (5 mg total) by mouth daily. 90 tablet 3  . valsartan-hydrochlorothiazide (DIOVAN-HCT) 160-12.5 MG tablet Take 1 tablet by mouth daily.    Marland Kitchen amLODipine (NORVASC) 10 MG tablet Take 0.5 tablets by mouth daily. (Patient not taking: Reported on 02/23/2021)    . [DISCONTINUED] Armodafinil (NUVIGIL) 250 MG tablet Take 250 mg by mouth daily.       No current facility-administered medications on file prior to visit.    ALLERGIES: Statins, Codeine,  Lipitor [atorvastatin calcium], Mevacor [lovastatin], Morphine, and Zocor [simvastatin]  Family History  Problem Relation Age of Onset  . Hyperlipidemia Mother   . Hypertension Mother   . Ovarian cancer Mother   . Hypertension Brother   . Stroke Brother   . Cancer Maternal Grandmother        uterine,ovarian  . Heart attack Paternal Grandmother     SH:  Widowed, non smoker  Review of Systems  Genitourinary: Positive for dysuria. Negative for decreased urine volume, difficulty urinating, pelvic pain, urgency, vaginal bleeding and vaginal discharge.    PHYSICAL EXAMINATION:    BP (!) 148/86 (BP Location: Right Arm, Patient Position: Sitting, Cuff Size: Small)   Pulse 77   Ht 5\' 2"  (1.575 m)   Wt 142 lb 3.2 oz (64.5 kg)   LMP 03/25/2009   BMI 26.01 kg/m     General appearance: alert, cooperative and appears stated age Lymph:  no inguinal LAD noted  Pelvic: External genitalia:  no lesions but erythema is present              Urethra:  normal appearing urethra with no masses, tenderness or lesions              Bartholins and Skenes: normal                 Vagina: normal appearing vagina with normal color and discharge, no lesions  Chaperone, 05/25/2009, CMA, was present for exam.  Assessment/Plan: 1. Hematuria, unspecified type - POCT Urinalysis Dipstick  2. Vulvar irritation - Cervicovaginal ancillary only( [place])  3. Dysuria - Urine Culture; Future - sulfamethoxazole-trimethoprim (BACTRIM DS) 800-160 MG tablet; Take 1 tablet by mouth 2 (two) times daily.  Dispense: 6 tablet; Refill: 0

## 2021-03-01 ENCOUNTER — Ambulatory Visit: Payer: Self-pay

## 2021-03-01 ENCOUNTER — Ambulatory Visit (HOSPITAL_COMMUNITY)
Admission: EM | Admit: 2021-03-01 | Discharge: 2021-03-01 | Disposition: A | Discharge status: Home / Self Care | Payer: Medicare Other | Attending: Student | Admitting: Student

## 2021-03-01 ENCOUNTER — Other Ambulatory Visit: Payer: Self-pay

## 2021-03-01 ENCOUNTER — Encounter (HOSPITAL_COMMUNITY): Payer: Self-pay | Admitting: Emergency Medicine

## 2021-03-01 DIAGNOSIS — B9689 Other specified bacterial agents as the cause of diseases classified elsewhere: Secondary | ICD-10-CM | POA: Diagnosis not present

## 2021-03-01 DIAGNOSIS — N3001 Acute cystitis with hematuria: Secondary | ICD-10-CM

## 2021-03-01 DIAGNOSIS — E118 Type 2 diabetes mellitus with unspecified complications: Secondary | ICD-10-CM

## 2021-03-01 DIAGNOSIS — E119 Type 2 diabetes mellitus without complications: Secondary | ICD-10-CM | POA: Diagnosis not present

## 2021-03-01 DIAGNOSIS — R300 Dysuria: Secondary | ICD-10-CM | POA: Diagnosis not present

## 2021-03-01 LAB — POCT URINALYSIS DIPSTICK, ED / UC
Bilirubin Urine: NEGATIVE
Glucose, UA: 500 mg/dL — AB
Ketones, ur: NEGATIVE mg/dL
Nitrite: NEGATIVE
Protein, ur: NEGATIVE mg/dL
Specific Gravity, Urine: <=1.005 (ref 1.005–1.030)
Urobilinogen, UA: 0.2 mg/dL (ref 0.0–1.0)
pH: 5 (ref 5.0–8.0)

## 2021-03-01 MED ORDER — CEPHALEXIN 500 MG PO CAPS: 500.0000 mg | ORAL_CAPSULE | Freq: Four times a day (QID) | ORAL | 0 refills | Status: DC

## 2021-03-01 NOTE — Discharge Instructions (Addendum)
-  Keflex four times daily for 5 days -We'll call you if we need to change the antibiotic, pending urine culture results -Follow-up with your primary care next week for titration of your diabetes medications. -Follow-up with your GYN if your symptoms persist. -Seek additional immediate medical attention if you develop new symptoms like worsening abdominal pain, new back pain, new fever/chills, etc.

## 2021-03-01 NOTE — ED Provider Notes (Signed)
[facility]    CSN: [id number] Arrival date & time: 03/01/21  1237      History   Chief Complaint Chief Complaint  Patient presents with  . Urinary Frequency  . Dysuria    HPI Kaylee Waters is a 75 y.o. female presenting with 2 days of dysuria and urinary frequency.  She was last seen at her GYN for same symptoms on 02/23/2021, they prescribed Bactrim for UTI.  She states that her symptoms resolved after taking this, but then returned.  A culture appears to have been collected, but it has not resulted.  Today notes 2 days of dysuria, urinary frequency, urinary urgency, suprapubic pressure. Denies hematuria,  back pain, n/v/d, fevers/chills, abdnormal vaginal discharge, vaginal irritation.  This patient does note that she was started on Jardiance for her new onset diabetes about 3 weeks ago, and her symptoms all started after that.  She states she plans to talk to her primary care and get this stopped.   HPI  Past Medical History:  Diagnosis Date  . Anxiety   . Cataract of both eyes   . Closed right tibial fracture 02/2017  . Depression   . Diverticulosis   . Dry eye   . Headache   . High cholesterol    controlled  . HTN (hypertension)    controlled  . Hyperglycemia   . IBS (irritable bowel syndrome)    w/ castipatia  . IBS (irritable bowel syndrome)   . Mitral valve prolapse   . Thyroid disease    hypothyroidism  . Vocal cord paresis     Patient Active Problem List   Diagnosis Date Noted  . IBS (irritable bowel syndrome)   . Chest pain at rest 08/24/2013  . Benign hypertensive heart disease without heart failure 12/31/2011  . Mood disorder (HCC) 09/10/2011  . Dyslipidemia 09/03/2011  . HTN (hypertension)   . Hyperglycemia   . MUSCLE PAIN 11/21/2009  . Hypothyroid 09/28/2009  . VITAMIN D DEFICIENCY 09/26/2009  . HYPERCALCEMIA 09/26/2009  . OTHER SYMPTOMS INVOLVING HEAD AND NECK 09/26/2009  . Other and unspecified hyperlipidemia 10/06/2007  .  GASTROESOPHAGEAL REFLUX DISEASE, CHRONIC 10/06/2007    Past Surgical History:  Procedure Laterality Date  . BUNIONECTOMY  1969   LEFT FOOT   . CATARACT EXTRACTION W/ INTRAOCULAR LENS  IMPLANT, BILATERAL  10/2016  . CHOLECYSTECTOMY    . DENTAL SURGERY  05/2016  . DILATION AND CURETTAGE OF UTERUS     MISCARRIAGE  . HYSTEROSCOPY  6/10   polyp prometrium; D&C  . KNEE ARTHROSCOPY Right 1989    OB History    Gravida  5   Para  3   Term  3   Preterm      AB  2   Living  3     SAB  2   IAB      Ectopic      Multiple  0   Live Births               Home Medications    Prior to Admission medications   Medication Sig Start Date End Date Taking? Authorizing Provider  cephALEXin (KEFLEX) 500 MG capsule Take 1 capsule (500 mg total) by mouth 4 (four) times daily. 03/01/21  Yes Rhys Martini, PA-C  ALPRAZolam Prudy Feeler) 1 MG tablet Take by mouth at bedtime as needed (sleep). Take 1-2mg  by mouth at bedtime as needed for sleep.    [provider]  amLODipine (NORVASC) 10  MG tablet Take 0.5 tablets by mouth daily. Patient not taking: Reported on 02/23/2021 02/06/18   [provider]  Ascorbic Acid (VITAMIN C) 1000 MG tablet Take 1,000 mg by mouth daily.    [provider]  aspirin 81 MG tablet Take 81 mg by mouth daily.    [provider]  buPROPion (WELLBUTRIN XL) 300 MG 24 hr tablet Take 300 mg by mouth daily.    [provider]  Cholecalciferol (VITAMIN D3) 2000 units capsule Take 4,000 Units by mouth daily. 04/03/18   [provider]  cloNIDine (CATAPRES - DOSED IN MG/24 HR) 0.2 mg/24hr patch Place onto the skin once a week. 08/31/17   [provider]  empagliflozin (JARDIANCE) 10 MG TABS tablet Take 10 mg by mouth daily.    [provider]  esomeprazole (NEXIUM) 20 MG capsule Take 20 mg by mouth daily at 12 noon.    [provider]  lamoTRIgine (LAMICTAL) 100 MG tablet Take 100 mg by mouth 2 (two)  times daily.    [provider]  levothyroxine (SYNTHROID, LEVOTHROID) 75 MCG tablet Take 1 tablet (75 mcg total) by mouth daily. BRAND ONLY 01/31/15   Cassell Clement, MD  potassium chloride SA (K-DUR,KLOR-CON) 20 MEQ tablet Take 20 mEq by mouth daily.    [provider]  rosuvastatin (CRESTOR) 5 MG tablet Take 1 tablet (5 mg total) by mouth daily. 09/08/15   Cassell Clement, MD  terconazole (TERAZOL 7) 0.4 % vaginal cream Place 1 applicator vaginally at bedtime. 02/23/21   Jerene Bears, MD  valsartan-hydrochlorothiazide (DIOVAN-HCT) 160-12.5 MG tablet Take 1 tablet by mouth daily. 08/06/19   [provider]  Armodafinil (NUVIGIL) 250 MG tablet Take 250 mg by mouth daily.    12/31/11  [provider]    Family History Family History  Problem Relation Age of Onset  . Hyperlipidemia Mother   . Hypertension Mother   . Ovarian cancer Mother   . Hypertension Brother   . Stroke Brother   . Cancer Maternal Grandmother        uterine,ovarian  . Heart attack Paternal Grandmother     Social History Social History   Tobacco Use  . Smoking status: Former Games developer  . Smokeless tobacco: Never Used  . Tobacco comment: quit in 1975  Vaping Use  . Vaping Use: Never used  Substance Use Topics  . Alcohol use: No  . Drug use: No     Allergies   Statins, Codeine, Lipitor [atorvastatin calcium], Mevacor [lovastatin], Morphine, and Zocor [simvastatin]   Review of Systems Review of Systems  Constitutional: Negative for chills and fever.  HENT: Negative for sore throat.   Eyes: Negative for pain and redness.  Respiratory: Negative for shortness of breath.   Cardiovascular: Negative for chest pain.  Gastrointestinal: Negative for abdominal pain, diarrhea, nausea and vomiting.  Genitourinary: Positive for dysuria, frequency and urgency. Negative for decreased urine volume, difficulty urinating, flank pain, genital sores, hematuria, vaginal bleeding, vaginal  discharge and vaginal pain.  Musculoskeletal: Negative for back pain.  Skin: Negative for rash.  All other systems reviewed and are negative.    Physical Exam Triage Vital Signs ED Triage Vitals  Enc Vitals Group     BP 03/01/21 1253 (!) 152/78     Pulse Rate 03/01/21 1253 87     Resp 03/01/21 1253 18     Temp 03/01/21 1256 97.9 F (36.6 C)     Temp Source 03/01/21 1253 Oral  SpO2 03/01/21 1253 99 %     Weight --      Height --      Head Circumference --      Peak Flow --      Pain Score 03/01/21 1251 3     Pain Loc --      Pain Edu? --      Excl. in GC? --    No data found.  Updated Vital Signs BP (!) 152/78 (BP Location: Left Arm)   Pulse 87   Temp 97.9 F (36.6 C) (Oral)   Resp 18   LMP 03/25/2009   SpO2 99%   Visual Acuity Right Eye Distance:   Left Eye Distance:   Bilateral Distance:    Right Eye Near:   Left Eye Near:    Bilateral Near:     Physical Exam Vitals reviewed.  Constitutional:      General: She is not in acute distress.    Appearance: Normal appearance. She is not ill-appearing.  HENT:     Head: Normocephalic and atraumatic.     Mouth/Throat:     Mouth: Mucous membranes are moist.     Comments: Moist mucous membranes Eyes:     Extraocular Movements: Extraocular movements intact.     Pupils: Pupils are equal, round, and reactive to light.  Cardiovascular:     Rate and Rhythm: Normal rate and regular rhythm.     Heart sounds: Normal heart sounds.  Pulmonary:     Effort: Pulmonary effort is normal.     Breath sounds: Normal breath sounds. No wheezing, rhonchi or rales.  Abdominal:     General: Bowel sounds are normal. There is no distension.     Palpations: Abdomen is soft. There is no mass.     Tenderness: There is abdominal tenderness in the suprapubic area. There is no right CVA tenderness, left CVA tenderness, guarding or rebound. Negative signs include Murphy's sign and Rovsing's sign.     Comments: No CVAT  Skin:     General: Skin is warm.     Capillary Refill: Capillary refill takes less than 2 seconds.     Comments: Good skin turgor  Neurological:     General: No focal deficit present.     Mental Status: She is alert and oriented to person, place, and time.  Psychiatric:        Mood and Affect: Mood normal.        Behavior: Behavior normal.      UC Treatments / Results  Labs (all labs ordered are listed, but only abnormal results are displayed) Labs Reviewed  POCT URINALYSIS DIPSTICK, ED / UC - Abnormal; Notable for the following components:      Result Value   Glucose, UA 500 (*)    Hgb urine dipstick TRACE (*)    Leukocytes,Ua SMALL (*)    All other components within normal limits  URINE CULTURE    EKG   Radiology No results found.  Procedures Procedures (including critical care time)  Medications Ordered in UC Medications - No data to display  Initial Impression / Assessment and Plan / UC Course  I have reviewed the triage vital signs and the nursing notes.  Pertinent labs & imaging results that were available during my care of the patient were reviewed by me and considered in my medical decision making (see chart for details).     This patient is a 75 year old female presenting with recurrent UTI.  Today she is  afebrile, nontachycardic, nontachypneic, no CVAT. Low suspicion for pyelo.  Has not taken antipyretic today.  She was last treated for UTI 1 week ago with Bactrim, her symptoms resolved but then returned few days later.  Culture was not performed at that time.   UA today with trace blood, trace leuk; positive for glucose, but she does take Jardiance.  Culture sent.  Plan to treat with Keflex, pending culture results.  Recommended close follow-up with GYN and PCP.  ED return precautions discussed.  Final Clinical Impressions(s) / UC Diagnoses   Final diagnoses:  Acute cystitis with hematuria  Dysuria  Type 2 diabetes mellitus without complication, without  long-term current use of insulin (HCC)     Discharge Instructions     -Keflex four times daily for 5 days -We'll call you if we need to change the antibiotic, pending urine culture results -Follow-up with your primary care next week for titration of your diabetes medications. -Follow-up with your GYN if your symptoms persist. -Seek additional immediate medical attention if you develop new symptoms like worsening abdominal pain, new back pain, new fever/chills, etc.   ED Prescriptions    Medication Sig Dispense Auth. Provider   cephALEXin (KEFLEX) 500 MG capsule Take 1 capsule (500 mg total) by mouth 4 (four) times daily. 20 capsule Rhys Martini, PA-C     PDMP not reviewed this encounter.   Rhys Martini, PA-C 03/01/21 1352

## 2021-03-01 NOTE — ED Triage Notes (Signed)
Pt presents today with dysuria and urine frequency. She reports being seen PCP on 02/23/21 for same and was given antibiotic which she completed the course. She continues to have dysuria symptoms.

## 2021-03-03 LAB — URINE CULTURE: Culture: NO GROWTH

## 2021-03-17 ENCOUNTER — Encounter: Payer: Self-pay | Admitting: Cardiovascular Disease

## 2021-05-07 ENCOUNTER — Ambulatory Visit (HOSPITAL_BASED_OUTPATIENT_CLINIC_OR_DEPARTMENT_OTHER): Payer: Medicare Other | Admitting: Cardiovascular Disease

## 2021-06-15 ENCOUNTER — Other Ambulatory Visit: Payer: Self-pay

## 2021-06-15 ENCOUNTER — Ambulatory Visit (INDEPENDENT_AMBULATORY_CARE_PROVIDER_SITE_OTHER): Payer: Medicare Other | Admitting: Cardiovascular Disease

## 2021-06-15 ENCOUNTER — Encounter (HOSPITAL_BASED_OUTPATIENT_CLINIC_OR_DEPARTMENT_OTHER): Payer: Self-pay | Admitting: Cardiovascular Disease

## 2021-06-15 DIAGNOSIS — I2510 Atherosclerotic heart disease of native coronary artery without angina pectoris: Secondary | ICD-10-CM

## 2021-06-15 DIAGNOSIS — I10 Essential (primary) hypertension: Secondary | ICD-10-CM

## 2021-06-15 DIAGNOSIS — E785 Hyperlipidemia, unspecified: Secondary | ICD-10-CM

## 2021-06-15 HISTORY — DX: Atherosclerotic heart disease of native coronary artery without angina pectoris: I25.10

## 2021-06-15 NOTE — Patient Instructions (Signed)
Medication Instructions:  Your physician recommends that you continue on your current medications as directed. Please refer to the Current Medication list given to you today.   *If you need a refill on your cardiac medications before your next appointment, please call your pharmacy*  Lab Work: NONE  Testing/Procedures: NONE  Follow-Up: At [facility], you and your health needs are our priority.  As part of our continuing mission to provide you with exceptional heart care, we have created designated Provider Care Teams.  These Care Teams include your primary Cardiologist (physician) and Advanced Practice Providers (APPs -  Physician Assistants and Nurse Practitioners) who all work together to provide you with the care you need, when you need it.  We recommend signing up for the patient portal called "MyChart".  Sign up information is provided on this After Visit Summary.  MyChart is used to connect with patients for Virtual Visits (Telemedicine).  Patients are able to view lab/test results, encounter notes, upcoming appointments, etc.  Non-urgent messages can be sent to your provider as well.   To learn more about what you can do with MyChart, go to [email or web address].    Your next appointment:   12 month(s)  The format for your next appointment:   In Person  Provider:   Tiffany [place], MD or Caitlin Walker, NP{         

## 2021-06-15 NOTE — Assessment & Plan Note (Signed)
Kaylee Waters had an abnormal stress test at [facility].  This was followed by coronary CTA that revealed minimal coronary artery disease and a calcium score that was 30th percentile.  Lipids are well-controlled and she has no exertional chest pain.  Continue aspirin and rosuvastatin.

## 2021-06-15 NOTE — Progress Notes (Signed)
Cardiology Office Note:    Date:  06/15/2021   ID:  Kaylee Waters, DOB April 24, 1946, MRN [id number]  PCP:  [name], MD   [facility] Providers Cardiologist:  None     Referring MD: [name], MD   No chief complaint on file.   History of Present Illness:    Kaylee Waters is a 75 y.o. female with a hx of hypertension, hyperlipidemia, anxiety, depression, diverticulosis, IBS, and hypothyroidism, here to establish care. She was previously a patient of Dr. [name], and last saw him in 2016. At that time she was doing well. She had a nuclear stress test in 2009 that was negative for ischemia, LVEF was 73%.  Today, she is feeling pretty good overall. At her last visit with Dr. [name] she reported having chest pain. At this time, she endorses infrequent, short episodes of sharp chest pain.  Several months ago she was diagnosed with diabetes. Previously, Metformin caused side effects of nausea, diarrhea, and Jardiance caused skin inflammation. Currently she takes Januvia and is tolerating it well. At home her blood pressure is typically well-controlled with readings around 120/80.  She notes that sometimes elevated when she visits a clinic. Occasionally at 6-7 PM she has 30 minute episodes where she feels like her heart is racing. However, she checked her pulse which was normal. This has not recurred in the past week or two, and she has not discovered any triggers. For her diet she generally eats well. For exercise, she regularly walks about 30 minutes a day, 5 times a week. During rainy weather she climbs the stairs several times. She denies any shortness of breath. No lightheadedness, headaches, syncope, orthopnea, or PND. Also has no lower extremity edema or exertional symptoms.   Past Medical History:  Diagnosis Date   Anxiety    CAD in native artery 06/15/2021   Cataract of both eyes    Closed right tibial fracture 02/2017   Depression    Diverticulosis    Dry  eye    Headache    High cholesterol    controlled   HTN (hypertension)    controlled   Hyperglycemia    IBS (irritable bowel syndrome)    w/ castipatia   IBS (irritable bowel syndrome)    Mitral valve prolapse    Thyroid disease    hypothyroidism   Vocal cord paresis     Past Surgical History:  Procedure Laterality Date   BUNIONECTOMY  1969   LEFT FOOT    CATARACT EXTRACTION W/ INTRAOCULAR LENS  IMPLANT, BILATERAL  10/2016   CHOLECYSTECTOMY     DENTAL SURGERY  05/2016   DILATION AND CURETTAGE OF UTERUS     MISCARRIAGE   HYSTEROSCOPY  6/10   polyp prometrium; D&C   KNEE ARTHROSCOPY Right 1989    Current Medications: Current Meds  Medication Sig   ALPRAZolam (XANAX) 1 MG tablet Take by mouth at bedtime as needed (sleep). Take 1-2mg  by mouth at bedtime as needed for sleep.   amLODipine (NORVASC) 10 MG tablet Take 10 mg by mouth daily.   aspirin 81 MG tablet Take 81 mg by mouth daily.   buPROPion (WELLBUTRIN XL) 300 MG 24 hr tablet Take 300 mg by mouth daily.   Cholecalciferol (VITAMIN D3) 2000 units capsule Take 4,000 Units by mouth daily.   cloNIDine (CATAPRES - DOSED IN MG/24 HR) 0.2 mg/24hr patch Place onto the skin once a week.   esomeprazole (NEXIUM) 20 MG capsule Take 20  mg by mouth daily at 12 noon.   lamoTRIgine (LAMICTAL) 100 MG tablet Take 100 mg by mouth 2 (two) times daily.   levothyroxine (SYNTHROID, LEVOTHROID) 75 MCG tablet Take 1 tablet (75 mcg total) by mouth daily. BRAND ONLY   potassium chloride SA (K-DUR,KLOR-CON) 20 MEQ tablet Take 20 mEq by mouth daily.   rosuvastatin (CRESTOR) 5 MG tablet Take 1 tablet (5 mg total) by mouth daily.   sitaGLIPtin (JANUVIA) 100 MG tablet Take 1 tablet by mouth daily.   valsartan-hydrochlorothiazide (DIOVAN-HCT) 160-12.5 MG tablet Take 1 tablet by mouth daily.     Allergies:   Statins, Codeine, Jardiance [empagliflozin], Lipitor [atorvastatin calcium], Metformin and related, Mevacor [lovastatin], Morphine, and Zocor  [simvastatin]   Social History   Socioeconomic History   Marital status: Widowed    Spouse name: Not on file   Number of children: 3   Years of education: Not on file   Highest education level: Not on file  Occupational History   Occupation: Nurse    Employer: [employer]  Tobacco Use   Smoking status: Former   Smokeless tobacco: Never   Tobacco comments:    quit in 1975  Vaping Use   Vaping Use: Never used  Substance and Sexual Activity   Alcohol use: No   Drug use: No   Sexual activity: Not Currently    Partners: Male    Birth control/protection: Post-menopausal    Comment: husband vasectomy  Other Topics Concern   Not on file  Social History Narrative   Not on file   Social Determinants of Health   Financial Resource Strain: Low Risk    Difficulty of Paying Living Expenses: Not hard at all  Food Insecurity: No Food Insecurity   Worried About Programme researcher, broadcasting/film/video in the Last Year: Never true   Ran Out of Food in the Last Year: Never true  Transportation Needs: No Transportation Needs   Lack of Transportation (Medical): No   Lack of Transportation (Non-Medical): No  Physical Activity: Sufficiently Active   Days of Exercise per Week: 5 days   Minutes of Exercise per Session: 30 min  Stress: Not on file  Social Connections: Not on file     Family History: The patient's family history includes Cancer in her maternal grandmother; Heart attack in her paternal grandmother; Hyperlipidemia in her mother; Hypertension in her brother and mother; Ovarian cancer in her mother; Stroke in her brother.  ROS:   Please see the history of present illness.    (+) Sharp chest pain (+) Palpitations All other systems reviewed and are negative.  EKGs/Labs/Other Studies Reviewed:    The following studies were reviewed today:  Coronary CTA 01/30/2021: IMPRESSION:  -  Calcium score of 6.53.  This places the patient in the 30th percentile for age and race based on the [place] data  base.  -  Normal left ventricular function with EF of 63%.  -  Normal origin and trajectory of coronary arteries.  - Technically difficult study. No obstructive plaque is seen within the limitations of this study.  -  An addendum commenting on non-cardiac structures to follow.   Echo Stress Exercise 01/20/2021: Left Ventricle: Systolic function is normal. EF: 60-65%.    Stress: The patient underwent a Bruce protocol stress test.    Stress: Total exercise time was 3 minutes. Estimated METs = 4.6.  and a peak blood pressure of 169/82.    Stress: The patient experienced no chest pain.  Stress: There was an appropriate heart rate response to exercise. The  patient achieved a heart rate of 150 beats per minute at maximum stress,  103% of the maximal age-predicted heart rate.    Stress ECG: Arrhythmias during stress were occasional premature  ventricular contractions.    Stress ECG: ECG Conclusion:  No ischemic ST segment changes occurred with stress.    Post-stress: The left ventricle systolic function is normal  post-stress.    Post-stress: The post-stress echo showed worsened wall motion  abnormalities compared to baseline which are indicative of myocardial  ischemia.    Post-stress Impression: The study is consistent with ischemia.    Post-stress Impression: This study shows an intermediate prognostic  risk.  EKG:   06/15/2021: Sinus rhythm. Rate 82 bpm.  Recent Labs: No results found for requested labs within last 8760 hours.  Recent Lipid Panel    Component Value Date/Time   CHOL 160 09/08/2015 0912   TRIG 69 09/08/2015 0912   HDL 64 09/08/2015 0912   CHOLHDL 2.5 09/08/2015 0912   VLDL 14 09/08/2015 0912   LDLCALC 82 09/08/2015 0912     Risk Assessment/Calculations:           Physical Exam:    Wt Readings from Last 3 Encounters:  06/15/21 140 lb (63.5 kg)  02/23/21 142 lb 3.2 oz (64.5 kg)  11/07/20 145 lb (65.8 kg)    VS:  BP 122/74 (BP Location: Right Arm,  Patient Position: Sitting)   Pulse 82   Ht 5\' 2"  (1.575 m)   Wt 140 lb (63.5 kg)   LMP 03/25/2009   BMI 25.61 kg/m  , BMI Body mass index is 25.61 kg/m. GENERAL:  Well appearing HEENT: Pupils equal round and reactive, fundi not visualized, oral mucosa unremarkable NECK:  No jugular venous distention, waveform within normal limits, carotid upstroke brisk and symmetric, no bruits LUNGS:  Clear to auscultation bilaterally HEART:  RRR.  PMI not displaced or sustained,S1 and S2 within normal limits, no S3, no S4, no clicks, no rubs, no murmurs ABD:  Flat, positive bowel sounds normal in frequency in pitch, no bruits, no rebound, no guarding, no midline pulsatile mass, no hepatomegaly, no splenomegaly EXT:  2 plus pulses throughout, no edema, no cyanosis no clubbing SKIN:  No rashes no nodules NEURO:  Cranial nerves II through XII grossly intact, motor grossly intact throughout PSYCH:  Cognitively intact, oriented to person place and time   ASSESSMENT:    1. Primary hypertension   2. Dyslipidemia   3. CAD in native artery    PLAN:   HTN (hypertension) Blood pressures well have been controlled.  She is interested in being on fewer medications.  However I suspect that her blood pressure would not remain controlled given that it is in the 120s to low 130s systolic.  Continue current regimen of amlodipine, clonidine, and losartan/HCTZ.  Continue regular exercise.  Dyslipidemia Lipids are very well-controlled on rosuvastatin.  CAD in native artery Ms. Guereca had an abnormal stress test at 05/25/2009.  This was followed by coronary CTA that revealed minimal coronary artery disease and a calcium score that was 30th percentile.  Lipids are well-controlled and she has no exertional chest pain.  Continue aspirin and rosuvastatin.   Disposition: FU with Jesusita Jocelyn C. [employer], MD, [facility] in 1 year.  Medication Adjustments/Labs and Tests Ordered: Current medicines are reviewed at length with the patient  today.  Concerns regarding medicines are outlined above.   No orders of the  defined types were placed in this encounter.  No orders of the defined types were placed in this encounter.   Patient Instructions  Medication Instructions:  Your physician recommends that you continue on your current medications as directed. Please refer to the Current Medication list given to you today.   *If you need a refill on your cardiac medications before your next appointment, please call your pharmacy*  Lab Work: NONE   Testing/Procedures: NONE   Follow-Up: [facility], you and your health needs are our priority.  As part of our continuing mission to provide you with exceptional heart care, we have created designated Provider Care Teams.  These Care Teams include your primary Cardiologist (physician) and Advanced Practice Providers (APPs -  Physician Assistants and Nurse Practitioners) who all work together to provide you with the care you need, when you need it.  We recommend signing up for the patient portal called "MyChart".  Sign up information is provided on this After Visit Summary.  MyChart is used to connect with patients for Virtual Visits (Telemedicine).  Patients are able to view lab/test results, encounter notes, upcoming appointments, etc.  Non-urgent messages can be sent to your provider as well.   To learn more about what you can do with MyChart, go to [email or web address].    Your next appointment:   12 month(s)  The format for your next appointment:   In Person  Provider:   Chilton Siiffany [place], MD or Gillian Shieldsaitlin Walker, NP       [facility],Mathew Stumpf,acting as a scribe for Chilton Siiffany [place], MD.,have documented all relevant documentation on the behalf of Chilton Siiffany [place], MD,as directed by  Chilton Siiffany [place], MD while in the presence of Chilton Siiffany [place], MD.  I, Yamila Cragin C. Duke Salviaandolph, MD have reviewed all documentation for this visit.  The documentation of the exam, diagnosis,  procedures, and orders on 06/15/2021 are all accurate and complete.   Signed, Chilton Siiffany , MD  06/15/2021 12:52 PM    [facility]

## 2021-06-15 NOTE — Assessment & Plan Note (Signed)
Lipids are very well-controlled on rosuvastatin. 

## 2021-06-15 NOTE — Assessment & Plan Note (Signed)
Blood pressures well have been controlled.  She is interested in being on fewer medications.  However I suspect that her blood pressure would not remain controlled given that it is in the 120s to low 130s systolic.  Continue current regimen of amlodipine, clonidine, and losartan/HCTZ.  Continue regular exercise.

## 2021-06-15 NOTE — Addendum Note (Signed)
Addended by: Regis Bill B on: 06/15/2021 02:18 PM   Modules accepted: Orders

## 2021-07-21 ENCOUNTER — Other Ambulatory Visit (HOSPITAL_BASED_OUTPATIENT_CLINIC_OR_DEPARTMENT_OTHER): Payer: Self-pay | Admitting: *Deleted

## 2021-07-21 DIAGNOSIS — E7800 Pure hypercholesterolemia, unspecified: Secondary | ICD-10-CM

## 2021-07-21 MED ORDER — AMLODIPINE BESYLATE 10 MG PO TABS: 10.0000 mg | ORAL_TABLET | Freq: Every day | ORAL | 3 refills | Status: DC

## 2021-07-21 MED ORDER — ROSUVASTATIN CALCIUM 5 MG PO TABS: 5.0000 mg | ORAL_TABLET | Freq: Every day | ORAL | 3 refills | Status: DC

## 2021-07-21 NOTE — Telephone Encounter (Signed)
Rx(s) sent to pharmacy electronically.  

## 2021-08-25 ENCOUNTER — Ambulatory Visit (HOSPITAL_COMMUNITY): Payer: Medicare Other

## 2021-11-19 ENCOUNTER — Encounter (HOSPITAL_BASED_OUTPATIENT_CLINIC_OR_DEPARTMENT_OTHER): Payer: Self-pay | Admitting: *Deleted

## 2022-02-26 ENCOUNTER — Other Ambulatory Visit (HOSPITAL_COMMUNITY)
Admission: RE | Admit: 2022-02-26 | Discharge: 2022-02-26 | Disposition: A | Discharge status: Home / Self Care | Payer: Medicare Other | Source: Ambulatory Visit | Attending: Obstetrics & Gynecology | Admitting: Obstetrics & Gynecology

## 2022-02-26 ENCOUNTER — Encounter (HOSPITAL_BASED_OUTPATIENT_CLINIC_OR_DEPARTMENT_OTHER): Payer: Self-pay | Admitting: Obstetrics & Gynecology

## 2022-02-26 ENCOUNTER — Ambulatory Visit (INDEPENDENT_AMBULATORY_CARE_PROVIDER_SITE_OTHER): Payer: Medicare Other | Admitting: Obstetrics & Gynecology

## 2022-02-26 VITALS — BP 128/70 | HR 62 | Ht 62.0 in | Wt 137.6 lb

## 2022-02-26 DIAGNOSIS — N952 Postmenopausal atrophic vaginitis: Secondary | ICD-10-CM

## 2022-02-26 DIAGNOSIS — Z1151 Encounter for screening for human papillomavirus (HPV): Secondary | ICD-10-CM | POA: Insufficient documentation

## 2022-02-26 DIAGNOSIS — Z01419 Encounter for gynecological examination (general) (routine) without abnormal findings: Secondary | ICD-10-CM

## 2022-02-26 DIAGNOSIS — Z124 Encounter for screening for malignant neoplasm of cervix: Secondary | ICD-10-CM | POA: Insufficient documentation

## 2022-02-26 DIAGNOSIS — M8588 Other specified disorders of bone density and structure, other site: Secondary | ICD-10-CM

## 2022-02-26 DIAGNOSIS — M8580 Other specified disorders of bone density and structure, unspecified site: Secondary | ICD-10-CM

## 2022-02-26 NOTE — Progress Notes (Signed)
76 y.o. [id number] Widowed White or Caucasian female here for breast and pelvic exam.  I am also following her for osteopenia.  She was on HRT but has stopped this.  She and I discussed repeat this with her mammogram.  Last BMD was 2019.  She felt it was done sooner but has reviewed her history.  Willing to proceed.  She will have this done in January with next mammogram.  Order placed.   ? ?Denies vaginal bleeding.   ? ?Patient's last menstrual period was 03/25/2009.          ?Sexually active: No.  ?H/O STD:  no ? ?Health Maintenance: ?PCP:  Elizabeth Palau, .  Last wellness appt was 10/30/2021.  Did blood work at that appt:   ?Vaccines are up to date:  has not shingrix vaccination ?Colonoscopy:  2015 ?MMG:  11/16/2021 Negative ?BMD:  03/13/2018 done at [facility].  Plan to repeat in early 2024 ?Last pap smear:  08/17/2019 Negative.   ?H/o abnormal pap smear:  remote hx ? ? ? reports that she has quit smoking. She has never used smokeless tobacco. She reports that she does not drink alcohol and does not use drugs. ? ?Past Medical History:  ?Diagnosis Date  ? Anxiety   ? 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 Cataract of both eyes   ? Closed right tibial fracture 02/2017  ? Depression   ? Diverticulosis   ? Dry eye   ? Headache   ? High cholesterol   ? controlled  ? HTN (hypertension)   ? controlled  ? Hyperglycemia   ? IBS (irritable bowel syndrome)   ? w/ castipatia  ? IBS (irritable bowel syndrome)   ? Mitral valve prolapse   ? Thyroid disease   ? hypothyroidism  ? Vocal cord paresis   ? ? ?Past Surgical History:  ?Procedure Laterality Date  ? BUNIONECTOMY  1969  ? LEFT FOOT   ? CATARACT EXTRACTION W/ INTRAOCULAR LENS  IMPLANT, BILATERAL  10/2016  ? CHOLECYSTECTOMY    ? DENTAL SURGERY  05/2016  ? DILATION AND CURETTAGE OF UTERUS    ? MISCARRIAGE  ? HYSTEROSCOPY  6/10  ? polyp prometrium; D&C  ? KNEE ARTHROSCOPY Right 1989  ? ? ?Current Outpatient Medications  ?Medication Sig Dispense Refill  ? ALPRAZolam (XANAX) 1 MG tablet  Take by mouth at bedtime as needed (sleep). Take 1-2mg  by mouth at bedtime as needed for sleep.    ? amLODipine (NORVASC) 10 MG tablet Take 1 tablet (10 mg total) by mouth daily. 90 tablet 3  ? aspirin 81 MG tablet Take 81 mg by mouth daily.    ? buPROPion (WELLBUTRIN XL) 300 MG 24 hr tablet Take 300 mg by mouth daily.    ? Cholecalciferol (VITAMIN D3) 2000 units capsule Take 4,000 Units by mouth daily.    ? cloNIDine (CATAPRES - DOSED IN MG/24 HR) 0.2 mg/24hr patch Place onto the skin once a week.    ? esomeprazole (NEXIUM) 20 MG capsule Take 20 mg by mouth daily at 12 noon.    ? lamoTRIgine (LAMICTAL) 100 MG tablet Take 100 mg by mouth 2 (two) times daily.    ? levothyroxine (SYNTHROID, LEVOTHROID) 75 MCG tablet Take 1 tablet (75 mcg total) by mouth daily. BRAND ONLY 90 tablet 3  ? potassium chloride SA (K-DUR,KLOR-CON) 20 MEQ tablet Take 20 mEq by mouth daily.    ? rosuvastatin (CRESTOR) 5 MG tablet Take 1 tablet (5 mg total) by mouth daily.  90 tablet 3  ? sitaGLIPtin (JANUVIA) 100 MG tablet Take 1 tablet by mouth daily.    ? valsartan-hydrochlorothiazide (DIOVAN-HCT) 160-12.5 MG tablet Take 1 tablet by mouth daily.    ? ?No current facility-administered medications for this visit.  ? ? ?Family History  ?Problem Relation Age of Onset  ? Hyperlipidemia Mother   ? Hypertension Mother   ? Ovarian cancer Mother   ? Hypertension Brother   ? Stroke Brother   ? Cancer Maternal Grandmother   ?     uterine,ovarian  ? Heart attack Paternal Grandmother   ? ? ?Review of Systems  ?Constitutional: Negative.   ?Genitourinary: Negative.   ? ?Exam:   ?BP 128/70 (BP Location: Right Arm, Patient Position: Sitting, Cuff Size: Normal)   Pulse 62   Ht 5\' 2"  (1.575 m) Comment: reported  Wt 137 lb 9.6 oz (62.4 kg)   LMP 03/25/2009   BMI 25.17 kg/m?   Height: 5\' 2"  (157.5 cm) (reported) ? ?General appearance: alert, cooperative and appears stated age ?Breasts: normal appearance, no masses or tenderness ?Abdomen: soft, non-tender;  bowel sounds normal; no masses,  no organomegaly ?Lymph nodes: Cervical, supraclavicular, and axillary nodes normal.  No abnormal inguinal nodes palpated ?Neurologic: Grossly normal ? ?Pelvic: External genitalia:  no lesions ?             Urethra:  normal appearing urethra with no masses, tenderness or lesions ?             Bartholins and Skenes: normal    ?             Vagina: normal appearing vagina with atrophic changes and no discharge, no lesions ?             Cervix: no lesions ?             Pap taken: Yes.   ?Bimanual Exam:  Uterus:  normal size, contour, position, consistency, mobility, non-tender ?             Adnexa: normal adnexa and no mass, fullness, tenderness ?              Rectovaginal: Confirms ?              Anus:  normal sphincter tone, no lesions ? ?Chaperone, 05/25/2009, CMA, was present for exam. ? ?Assessment/Plan: ?1. Encntr for gyn exam (general) (routine) w/o abn findings ?- pap obtained today ?- MMG 10/2021 ?- colonoscopy 2015, follow up 10 year ?- BMD 2019 ?- vaccines reviewed/updated ?- lab work done with 2016 ? ?2. Osteopenia, unspecified location ?- DG BONE DENSITY (DXA); Future ? ?3. Other specified disorders of bone density and structure, other  ? ?4. Vaginal atrophy ? ? ? ?

## 2022-03-01 LAB — CYTOLOGY - PAP
Comment: NEGATIVE
Diagnosis: NEGATIVE
High risk HPV: NEGATIVE

## 2022-06-07 ENCOUNTER — Encounter (HOSPITAL_BASED_OUTPATIENT_CLINIC_OR_DEPARTMENT_OTHER): Payer: Self-pay | Admitting: *Deleted

## 2022-06-07 DIAGNOSIS — R195 Other fecal abnormalities: Secondary | ICD-10-CM | POA: Insufficient documentation

## 2022-06-07 DIAGNOSIS — R490 Dysphonia: Secondary | ICD-10-CM | POA: Insufficient documentation

## 2022-06-14 ENCOUNTER — Ambulatory Visit (INDEPENDENT_AMBULATORY_CARE_PROVIDER_SITE_OTHER): Payer: Medicare Other | Admitting: Cardiovascular Disease

## 2022-06-14 ENCOUNTER — Encounter (HOSPITAL_BASED_OUTPATIENT_CLINIC_OR_DEPARTMENT_OTHER): Payer: Self-pay | Admitting: Cardiovascular Disease

## 2022-06-14 DIAGNOSIS — I10 Essential (primary) hypertension: Secondary | ICD-10-CM | POA: Diagnosis not present

## 2022-06-14 DIAGNOSIS — I2510 Atherosclerotic heart disease of native coronary artery without angina pectoris: Secondary | ICD-10-CM

## 2022-06-14 DIAGNOSIS — R079 Chest pain, unspecified: Secondary | ICD-10-CM | POA: Diagnosis not present

## 2022-06-14 NOTE — Assessment & Plan Note (Signed)
She had a couple episodes of chest pain at rest.  She has been very physically active and never has chest pain with exertion.  Given her coronary CTA with minimal disease on 12/2020 and her atypical chest pain, no plans for repeat ischemic evaluation at this time.  Patient was reassured and encouraged to keep up her exercise.

## 2022-06-14 NOTE — Progress Notes (Signed)
Cardiology Office Note:    Date:  06/14/2022   ID:  Kaylee Waters, DOB 06/06/1946, MRN [id number]  PCP:  [name], MD   [facility] Providers Cardiologist:  None     Referring MD: [name], MD    History of Present Illness:    Kaylee Waters is a 76 y.o. female with a hx of hypertension, hyperlipidemia, anxiety, depression, diverticulosis, IBS, non-obstructive CAD, and hypothyroidism, here to follow up. She was first seen 05/2021 to reestablish care. She was previously a patient of Dr. [name], and last saw him in 2016. At that time she was doing well. She had a nuclear stress test in 2009 that was negative for ischemia, LVEF was 73%.  At her initial visit, she was doing well and encouraged to work on lifestyle modification. She had an abnormal stress test at [facility] 12/2020, however, coronary CTA revealed minimal non-obstructve CAD and a calcium score in the 30th percentile.  Today, she says she is doing pretty good but has occasional chest pain that lasts about three seconds and occurs about once weekly. She states that normally this chest pain happens when she is at rest and she becomes anxious when it first occurs after a while. Additionally, her blood pressure has been stable at home when she checks. In regards to exercise, she has a recumbent stationary bike but has not used it recently because of a stretched muscle. She has been doing a lot of house cleaning for her exercise lately. She has not been walking for exercise lately due to the heat. Throughout her exercise and activities, she reports feeling well. She denies any palpitations, shortness of breath, or peripheral edema. No lightheadedness, headaches, syncope, orthopnea, or PND.   Past Medical History:  Diagnosis Date   Anxiety    CAD in native artery 06/15/2021   Cataract of both eyes    Closed right tibial fracture 02/2017   Depression    Diverticulosis    Dry eye    Headache    High  cholesterol    controlled   HTN (hypertension)    controlled   Hyperglycemia    IBS (irritable bowel syndrome)    w/ castipatia   IBS (irritable bowel syndrome)    Mitral valve prolapse    Thyroid disease    hypothyroidism   Vocal cord paresis     Past Surgical History:  Procedure Laterality Date   BUNIONECTOMY  1969   LEFT FOOT    CATARACT EXTRACTION W/ INTRAOCULAR LENS  IMPLANT, BILATERAL  10/2016   CHOLECYSTECTOMY     DENTAL SURGERY  05/2016   DILATION AND CURETTAGE OF UTERUS     MISCARRIAGE   HYSTEROSCOPY  6/10   polyp prometrium; D&C   KNEE ARTHROSCOPY Right 1989    Current Medications: Current Meds  Medication Sig   ALPRAZolam (XANAX) 1 MG tablet Take by mouth at bedtime as needed (sleep). Take 1-2mg  by mouth at bedtime as needed for sleep.   amLODipine (NORVASC) 10 MG tablet Take 1 tablet (10 mg total) by mouth daily.   aspirin 81 MG tablet Take 81 mg by mouth daily.   buPROPion (WELLBUTRIN XL) 300 MG 24 hr tablet Take 300 mg by mouth daily.   Cholecalciferol (VITAMIN D3) 2000 units capsule Take 4,000 Units by mouth daily.   cloNIDine (CATAPRES - DOSED IN MG/24 HR) 0.2 mg/24hr patch Place onto the skin once a week.   esomeprazole (NEXIUM) 40 MG capsule TAKE ONE  CAPSULE BY MOUTH DAILY 30 MINUTES BEFORE BREAKFAST   lamoTRIgine (LAMICTAL) 100 MG tablet Take 100 mg by mouth 2 (two) times daily.   levothyroxine (SYNTHROID, LEVOTHROID) 75 MCG tablet Take 1 tablet (75 mcg total) by mouth daily. BRAND ONLY   potassium chloride SA (K-DUR,KLOR-CON) 20 MEQ tablet Take 20 mEq by mouth daily.   rosuvastatin (CRESTOR) 5 MG tablet Take 1 tablet (5 mg total) by mouth daily.   sitaGLIPtin (JANUVIA) 100 MG tablet Take 1 tablet by mouth daily.   valsartan-hydrochlorothiazide (DIOVAN-HCT) 160-12.5 MG tablet Take 1 tablet by mouth daily.   [DISCONTINUED] esomeprazole (NEXIUM) 20 MG capsule Take 20 mg by mouth daily at 12 noon.     Allergies:   Statins, Codeine, Jardiance  [empagliflozin], Lipitor [atorvastatin calcium], Metformin and related, Mevacor [lovastatin], Morphine, and Zocor [simvastatin]   Social History   Socioeconomic History   Marital status: Widowed    Spouse name: Not on file   Number of children: 3   Years of education: Not on file   Highest education level: Not on file  Occupational History   Occupation: Nurse    Employer: [employer]  Tobacco Use   Smoking status: Former   Smokeless tobacco: Never   Tobacco comments:    quit in 1975  Vaping Use   Vaping Use: Never used  Substance and Sexual Activity   Alcohol use: No   Drug use: No   Sexual activity: Not Currently    Partners: Male    Birth control/protection: Post-menopausal    Comment: husband vasectomy  Other Topics Concern   Not on file  Social History Narrative   Not on file   Social Determinants of Health   Financial Resource Strain: Low Risk  (06/15/2021)   Overall Financial Resource Strain (CARDIA)    Difficulty of Paying Living Expenses: Not hard at all  Food Insecurity: No Food Insecurity (06/15/2021)   Hunger Vital Sign    Worried About Running Out of Food in the Last Year: Never true    Ran Out of Food in the Last Year: Never true  Transportation Needs: No Transportation Needs (06/15/2021)   PRAPARE - Transportation    Lack of Transportation (Medical): No    Lack of Transportation (Non-Medical): No  Physical Activity: Sufficiently Active (06/15/2021)   Exercise Vital Sign    Days of Exercise per Week: 5 days    Minutes of Exercise per Session: 30 min  Stress: Not on file  Social Connections: Not on file     Family History: The patient's family history includes Cancer in her maternal grandmother; Heart attack in her paternal grandmother; Hyperlipidemia in her mother; Hypertension in her brother and mother; Ovarian cancer in her mother; Stroke in her brother.  ROS:   Please see the history of present illness.    (+) Chest pain  All other systems  reviewed and are negative.  EKGs/Labs/Other Studies Reviewed:    The following studies were reviewed today:  Coronary CTA 01/30/2021 [facility]): IMPRESSION:  -  Calcium score of 6.53.  This places the patient in the 30th percentile for age and race based on the [place] data base.  -  Normal left ventricular function with EF of 63%.  -  Normal origin and trajectory of coronary arteries.  - Technically difficult study. No obstructive plaque is seen within the limitations of this study.  -  An addendum commenting on non-cardiac structures to follow.   Echo Stress Exercise 01/20/2021 [facility]): Left  Ventricle: Systolic function is normal. EF: 60-65%.    Stress: The patient underwent a Bruce protocol stress test.    Stress: Total exercise time was 3 minutes. Estimated METs = 4.6.  and a peak blood pressure of 169/82.    Stress: The patient experienced no chest pain.    Stress: There was an appropriate heart rate response to exercise. The  patient achieved a heart rate of 150 beats per minute at maximum stress,  103% of the maximal age-predicted heart rate.    Stress ECG: Arrhythmias during stress were occasional premature  ventricular contractions.    Stress ECG: ECG Conclusion:  No ischemic ST segment changes occurred with stress.    Post-stress: The left ventricle systolic function is normal  post-stress.    Post-stress: The post-stress echo showed worsened wall motion  abnormalities compared to baseline which are indicative of myocardial  ischemia.    Post-stress Impression: The study is consistent with ischemia.    Post-stress Impression: This study shows an intermediate prognostic  risk.  EKG: EKG is personally reviewed.  06/14/22: Sinus rhythm. Rate 89 bpm. LAFB. 06/15/2021: Sinus rhythm. Rate 82 bpm.  Recent Labs: No results found for requested labs within last 365 days.  Recent Lipid Panel    Component Value Date/Time   CHOL 160 09/08/2015 0912   TRIG 69 09/08/2015  0912   HDL 64 09/08/2015 0912   CHOLHDL 2.5 09/08/2015 0912   VLDL 14 09/08/2015 0912   LDLCALC 82 09/08/2015 0912    61/6/23: Total cholesterol 182, triglycerides 89, HDL 72, LDL 94 Sodium 139, potassium 4.5, BUN 24, creatinine AST 26, ALT 20  Risk Assessment/Calculations:           Physical Exam:    Wt Readings from Last 3 Encounters:  06/14/22 140 lb 9.6 oz (63.8 kg)  02/26/22 137 lb 9.6 oz (62.4 kg)  06/15/21 140 lb (63.5 kg)    VS:  BP 134/84 (BP Location: Right Arm, Patient Position: Sitting, Cuff Size: Normal)   Pulse 89   Ht 5\' 2"  (1.575 m)   Wt 140 lb 9.6 oz (63.8 kg)   LMP 03/25/2009   BMI 25.72 kg/m  , BMI Body mass index is 25.72 kg/m. GENERAL:  Well appearing HEENT: Pupils equal round and reactive, fundi not visualized, oral mucosa unremarkable NECK:  No jugular venous distention, waveform within normal limits, carotid upstroke brisk and symmetric, no bruits LUNGS:  Clear to auscultation bilaterally HEART:  RRR.  PMI not displaced or sustained,S1 and S2 within normal limits, no S3, no S4, no clicks, no rubs, no murmurs ABD:  Flat, positive bowel sounds normal in frequency in pitch, no bruits, no rebound, no guarding, no midline pulsatile mass, no hepatomegaly, no splenomegaly EXT:  2 plus pulses throughout, no edema, no cyanosis no clubbing SKIN:  No rashes no nodules NEURO:  Cranial nerves II through XII grossly intact, motor grossly intact throughout PSYCH:  Cognitively intact, oriented to person place and time   ASSESSMENT:    1. Primary hypertension   2. CAD in native artery   3. Chest pain at rest     PLAN:    HTN (hypertension) Blood pressure has been well-controlled   CAD in native artery Minimal disease on coronary CT-A 12/2020.  Lipids are managed with her PCP.  Continue rosuvastatin.      Chest pain at rest She had a couple episodes of chest pain at rest.  She has been very physically active and never has  chest pain with exertion.   Given her coronary CTA with minimal disease on 12/2020 and her atypical chest pain, no plans for repeat ischemic evaluation at this time.  Patient was reassured and encouraged to keep up her exercise.    Disposition: FU with Fares Ramthun C. Duke Salvia, MD, [facility] in 1 year.  Medication Adjustments/Labs and Tests Ordered: Current medicines are reviewed at length with the patient today.  Concerns regarding medicines are outlined above.   Orders Placed This Encounter  Procedures   EKG 12-Lead    Patient Instructions  Medication Instructions:  Your physician recommends that you continue on your current medications as directed. Please refer to the Current Medication list given to you today.   *If you need a refill on your cardiac medications before your next appointment, please call your pharmacy*  Lab Work: NONE  Testing/Procedures: NONE  Follow-Up: At [facility], you and your health needs are our priority.  As part of our continuing mission to provide you with exceptional heart care, we have created designated Provider Care Teams.  These Care Teams include your primary Cardiologist (physician) and Advanced Practice Providers (APPs -  Physician Assistants and Nurse Practitioners) who all work together to provide you with the care you need, when you need it.  We recommend signing up for the patient portal called "MyChart".  Sign up information is provided on this After Visit Summary.  MyChart is used to connect with patients for Virtual Visits (Telemedicine).  Patients are able to view lab/test results, encounter notes, upcoming appointments, etc.  Non-urgent messages can be sent to your provider as well.   To learn more about what you can do with MyChart, go to [email or web address].    Your next appointment:   12 month(s)  The format for your next appointment:   In Person  Provider:   Chilton Si, MD          I,Breanna Adamick,acting as a scribe for Chilton Si,  MD.,have documented all relevant documentation on the behalf of Chilton Si, MD,as directed by  Chilton Si, MD while in the presence of Chilton Si, MD.   I, Brandyn Lowrey C. Duke Salvia, MD have reviewed all documentation for this visit.  The documentation of the exam, diagnosis, procedures, and orders on 06/14/2022 are all accurate and complete.   Signed, Chilton Si, MD  06/14/2022 9:50 AM    [facility]

## 2022-06-14 NOTE — Assessment & Plan Note (Signed)
Minimal disease on coronary CT-A 12/2020.  Lipids are managed with her PCP.  Continue rosuvastatin.

## 2022-06-14 NOTE — Assessment & Plan Note (Signed)
Blood pressure has been well-controlled

## 2022-06-14 NOTE — Patient Instructions (Signed)
Medication Instructions:  ?Your physician recommends that you continue on your current medications as directed. Please refer to the Current Medication list given to you today.  ? ?*If you need a refill on your cardiac medications before your next appointment, please call your pharmacy* ? ?Lab Work: ?NONE ? ?Testing/Procedures: ?NONE ? ?Follow-Up: ?At [facility], you and your health needs are our priority.  As part of our continuing mission to provide you with exceptional heart care, we have created designated Provider Care Teams.  These Care Teams include your primary Cardiologist (physician) and Advanced Practice Providers (APPs -  Physician Assistants and Nurse Practitioners) who all work together to provide you with the care you need, when you need it. ? ?We recommend signing up for the patient portal called "MyChart".  Sign up information is provided on this After Visit Summary.  MyChart is used to connect with patients for Virtual Visits (Telemedicine).  Patients are able to view lab/test results, encounter notes, upcoming appointments, etc.  Non-urgent messages can be sent to your provider as well.   ?To learn more about what you can do with MyChart, go to [email or web address].   ? ?Your next appointment:   ?12 month(s) ? ?The format for your next appointment:   ?In Person ? ?Provider:   ?Tiffany Hays, MD  ? ? ? ? ? ? ?

## 2022-08-07 ENCOUNTER — Other Ambulatory Visit (HOSPITAL_BASED_OUTPATIENT_CLINIC_OR_DEPARTMENT_OTHER): Payer: Self-pay | Admitting: Cardiovascular Disease

## 2022-08-09 NOTE — Telephone Encounter (Signed)
Rx(s) sent to pharmacy electronically.  

## 2022-08-29 ENCOUNTER — Other Ambulatory Visit (HOSPITAL_BASED_OUTPATIENT_CLINIC_OR_DEPARTMENT_OTHER): Payer: Self-pay | Admitting: Cardiovascular Disease

## 2022-08-29 DIAGNOSIS — E7800 Pure hypercholesterolemia, unspecified: Secondary | ICD-10-CM

## 2022-11-23 ENCOUNTER — Other Ambulatory Visit (HOSPITAL_BASED_OUTPATIENT_CLINIC_OR_DEPARTMENT_OTHER): Payer: Self-pay | Admitting: Obstetrics & Gynecology

## 2022-12-13 ENCOUNTER — Encounter (HOSPITAL_BASED_OUTPATIENT_CLINIC_OR_DEPARTMENT_OTHER): Payer: Self-pay | Admitting: *Deleted

## 2022-12-13 ENCOUNTER — Other Ambulatory Visit (HOSPITAL_BASED_OUTPATIENT_CLINIC_OR_DEPARTMENT_OTHER): Payer: Self-pay | Admitting: *Deleted

## 2022-12-13 DIAGNOSIS — M8580 Other specified disorders of bone density and structure, unspecified site: Secondary | ICD-10-CM

## 2022-12-13 DIAGNOSIS — M8588 Other specified disorders of bone density and structure, other site: Secondary | ICD-10-CM

## 2023-02-10 ENCOUNTER — Telehealth: Payer: Self-pay | Admitting: Cardiovascular Disease

## 2023-02-10 MED ORDER — CLONIDINE HCL 0.2 MG/24HR TD PTWK: 0.2000 mg | MEDICATED_PATCH | TRANSDERMAL | 3 refills | Status: DC

## 2023-02-10 NOTE — Addendum Note (Signed)
Addended by: Regis Bill B on: 02/10/2023 03:57 PM   Modules accepted: Orders

## 2023-02-10 NOTE — Addendum Note (Signed)
Addended by: Regis Bill B on: 02/10/2023 03:45 PM   Modules accepted: Orders

## 2023-02-10 NOTE — Telephone Encounter (Signed)
Tried to send Rx 3 times, kept printing  Called to [facility], spoke with [employer]

## 2023-02-10 NOTE — Telephone Encounter (Signed)
  Pt c/o medication issue:  1. Name of Medication:   cloNIDine (CATAPRES - DOSED IN MG/24 HR) 0.2 mg/24hr patch    2. How are you currently taking this medication (dosage and times per day)? Place onto the skin once a week.   3. Are you having a reaction (difficulty breathing--STAT)? No   4. What is your medication issue? Pt said, her clonidine patch fell off while take a shower, she couldn't get another refill until 04/26. She would like to ask Dr. Duke Salvia if its ok for her without it until 04/26

## 2023-02-10 NOTE — Telephone Encounter (Signed)
Spoke with patient and called pharmacy [facility] does not have any in stock, [facility] does  New updated Rx sent to [facility] Advised patient and she will pick up there

## 2023-02-28 ENCOUNTER — Ambulatory Visit (HOSPITAL_BASED_OUTPATIENT_CLINIC_OR_DEPARTMENT_OTHER): Payer: Medicare Other | Admitting: Obstetrics & Gynecology

## 2023-04-14 ENCOUNTER — Telehealth (HOSPITAL_BASED_OUTPATIENT_CLINIC_OR_DEPARTMENT_OTHER): Payer: Self-pay | Admitting: Cardiovascular Disease

## 2023-04-14 NOTE — Telephone Encounter (Signed)
Returned pt's call about increased HR. Pt stated her Kardia device showed her HR as 108 and in Afib this morning upon waking. Her BP at the time was BP 127/76. Pt stated she had/has no dizziness, pain, was not light headed, not SOB, palpitations or any other symptoms other than slight fatigue. Patient stated she has not had anything to drink this morning as she was waiting for our call back before having anything. Pt stated her current HR was 88 and her BP has been stable the past week as she checks her VS every morning. Advised pt to drink plenty of water and hydrate herself. Advised her that if she develops any symptoms like SOB, dizziness, etc to call 911 or go to the nearest urgent care or ED. Told pt I would past this information along to Dr. Duke Salvia for review and that she would get a call if there was any further advise.

## 2023-04-14 NOTE — Telephone Encounter (Signed)
STAT if HR is under 50 or over 120 (normal HR is 60-100 beats per minute)  What is your heart rate? 108 this morning watch said possible Afib, just a few minutes ago it was 101 abd it swaid Tachycardia  Do you have a log of your heart rate readings (document readings)?   Do you have any other symptoms? A little tired

## 2023-04-15 NOTE — Telephone Encounter (Signed)
April 15, 2023 Chilton Si, MD  to Richelle Ito, RN  Me     04/15/23 10:37 AM  Can she upload the [place] tracing?  TCR  Left message to call back and sent mychart message

## 2023-04-18 NOTE — Telephone Encounter (Signed)
Discussed with patient  She will try to upload, if not will call back to let us know

## 2023-04-18 NOTE — Telephone Encounter (Signed)
Patient was returning call. Please advise ?

## 2023-04-19 ENCOUNTER — Other Ambulatory Visit (HOSPITAL_COMMUNITY)
Admission: RE | Admit: 2023-04-19 | Discharge: 2023-04-19 | Disposition: A | Discharge status: Home / Self Care | Payer: Medicare Other | Source: Ambulatory Visit | Attending: Obstetrics & Gynecology | Admitting: Obstetrics & Gynecology

## 2023-04-19 ENCOUNTER — Encounter (HOSPITAL_BASED_OUTPATIENT_CLINIC_OR_DEPARTMENT_OTHER): Payer: Self-pay | Admitting: Obstetrics & Gynecology

## 2023-04-19 ENCOUNTER — Ambulatory Visit (INDEPENDENT_AMBULATORY_CARE_PROVIDER_SITE_OTHER): Payer: Medicare Other | Admitting: Obstetrics & Gynecology

## 2023-04-19 VITALS — BP 120/72 | HR 74 | Ht 62.0 in | Wt 150.2 lb

## 2023-04-19 DIAGNOSIS — Z01419 Encounter for gynecological examination (general) (routine) without abnormal findings: Secondary | ICD-10-CM | POA: Diagnosis not present

## 2023-04-19 DIAGNOSIS — Z124 Encounter for screening for malignant neoplasm of cervix: Secondary | ICD-10-CM | POA: Insufficient documentation

## 2023-04-19 DIAGNOSIS — Z8739 Personal history of other diseases of the musculoskeletal system and connective tissue: Secondary | ICD-10-CM

## 2023-04-19 DIAGNOSIS — N952 Postmenopausal atrophic vaginitis: Secondary | ICD-10-CM

## 2023-04-19 NOTE — Telephone Encounter (Signed)
Documents dropped off for review. Will give to MD upon return to office.

## 2023-04-19 NOTE — Progress Notes (Signed)
77 y.o. [id number] Widowed White or Caucasian female here for breast and pelvic exam.  I am also following her for osteopenia.  H/o HRT use.  BMD was normal.  This was done 11/2022.    Unrelated, she's started having some hip pain after climbing in and out of a tall pick-up truck.  Has been having trouble riding a bicycle.  Sports medicine providers discussed.  Also having some hammer toe issues/bunion issue and wants podiatrist suggestion.  Denies vaginal bleeding.  Patient's last menstrual period was 03/25/2009.          Sexually active: No.  H/O STD:  no  Health Maintenance: PCP:  Dr. Cyndia Bent.  Last wellness appt was 02/02/2023.  Did blood work at that appt:  yes Vaccines are up to date:  has not done shingrix Colonoscopy:  2015, Dr. Matthias Hughs MMG:  12/03/2022 Negative BMD:  12/03/2022 Negative Last pap smear:  02/26/2022 Negative.   H/o abnormal pap smear:  remote hx   reports that she has quit smoking. She has never used smokeless tobacco. She reports that she does not drink alcohol and does not use drugs.  Past Medical History:  Diagnosis Date   Anxiety    CAD in native artery 06/15/2021   Cataract of both eyes    Closed right tibial fracture 02/2017   Depression    Diverticulosis    Dry eye    Headache    High cholesterol    controlled   HTN (hypertension)    controlled   Hyperglycemia    IBS (irritable bowel syndrome)    w/ castipatia   IBS (irritable bowel syndrome)    Mitral valve prolapse    Thyroid disease    hypothyroidism   Vocal cord paresis     Past Surgical History:  Procedure Laterality Date   BUNIONECTOMY  1969   LEFT FOOT    CATARACT EXTRACTION W/ INTRAOCULAR LENS  IMPLANT, BILATERAL  10/2016   CHOLECYSTECTOMY     DENTAL SURGERY  05/2016   DILATION AND CURETTAGE OF UTERUS     MISCARRIAGE   HYSTEROSCOPY  6/10   polyp prometrium; D&C   KNEE ARTHROSCOPY Right 1989    Current Outpatient Medications  Medication Sig Dispense Refill   ALPRAZolam  (XANAX) 1 MG tablet Take by mouth at bedtime as needed (sleep). Take 1-2mg  by mouth at bedtime as needed for sleep.     amLODipine (NORVASC) 10 MG tablet TAKE ONE TABLET BY MOUTH DAILY 90 tablet 2   buPROPion (WELLBUTRIN XL) 300 MG 24 hr tablet Take 300 mg by mouth daily.     Cholecalciferol (VITAMIN D3) 2000 units capsule Take 4,000 Units by mouth daily.     cloNIDine (CATAPRES - DOSED IN MG/24 HR) 0.2 mg/24hr patch Place 1 patch (0.2 mg total) onto the skin once a week. 12 patch 3   esomeprazole (NEXIUM) 40 MG capsule TAKE ONE CAPSULE BY MOUTH DAILY 30 MINUTES BEFORE BREAKFAST     lamoTRIgine (LAMICTAL) 100 MG tablet Take 100 mg by mouth 2 (two) times daily.     levothyroxine (SYNTHROID, LEVOTHROID) 75 MCG tablet Take 1 tablet (75 mcg total) by mouth daily. BRAND ONLY 90 tablet 3   potassium chloride SA (K-DUR,KLOR-CON) 20 MEQ tablet Take 20 mEq by mouth daily.     rosuvastatin (CRESTOR) 5 MG tablet TAKE ONE TABLET BY MOUTH DAILY 90 tablet 3   sitaGLIPtin (JANUVIA) 100 MG tablet Take 1 tablet by mouth daily.     valsartan-hydrochlorothiazide (DIOVAN-HCT)  160-12.5 MG tablet Take 1 tablet by mouth daily.     No current facility-administered medications for this visit.    Family History  Problem Relation Age of Onset   Hyperlipidemia Mother    Hypertension Mother    Ovarian cancer Mother    Hypertension Brother    Stroke Brother    Cancer Maternal Grandmother        uterine,ovarian   Heart attack Paternal Grandmother     Review of Systems  Constitutional: Negative.   Genitourinary: Negative.     Exam:   BP 120/72 (BP Location: Right Arm, Patient Position: Sitting, Cuff Size: Large)   Pulse 74   Ht 5\' 2"  (1.575 m) Comment: Reported  Wt 150 lb 3.2 oz (68.1 kg)   LMP 03/25/2009   BMI 27.47 kg/m   Height: 5\' 2"  (157.5 cm) (Reported)  General appearance: alert, cooperative and appears stated age Breasts: normal appearance, no masses or tenderness Abdomen: soft, non-tender; bowel  sounds normal; no masses,  no organomegaly Lymph nodes: Cervical, supraclavicular, and axillary nodes normal.  No abnormal inguinal nodes palpated Neurologic: Grossly normal  Pelvic: External genitalia:  no lesions              Urethra:  normal appearing urethra with no masses, tenderness or lesions              Bartholins and Skenes: normal                 Vagina: normal appearing vagina with atrophic changes and no discharge, no lesions              Cervix: no lesions              Pap taken: Yes.   Bimanual Exam:  Uterus:  normal size, contour, position, consistency, mobility, non-tender              Adnexa: normal adnexa and no mass, fullness, tenderness               Rectovaginal: Confirms               Anus:  normal sphincter tone, no lesions  Chaperone, Ina Homes, CMA, was present for exam.  Assessment/Plan: 1. Encntr for gyn exam (general) (routine) w/o abn findings - Pap smear obtained today.  Pt desires yearly pap smear. - Mammogram 12/03/2022 - Colonoscopy 2015, with Dr. Matthias Hughs.  Due next year or possibly cologuard. - Bone mineral density 12/03/2022, normal - lab work done with PCP, Dr. Cyndia Bent 02/02/2023 - vaccines reviewed/updated  2. Cervical cancer screening - Cytology - PAP( [place]) - PR OBTAINING SCREEN PAP SMEAR  3. History of osteopenia - she is taking 4000 IUD Vit D daily.  With hx of vit ad deficiency and her petite frame, feel she should continue this - BMD last year was normal.  Consider repeating 3-4 years  4. Vaginal atrophy

## 2023-04-20 ENCOUNTER — Encounter (HOSPITAL_BASED_OUTPATIENT_CLINIC_OR_DEPARTMENT_OTHER): Payer: Self-pay

## 2023-04-20 NOTE — Telephone Encounter (Signed)
Dr Duke Salvia reviewed Kaylee Waters strips, no Afib note it is SR and sinus tachy No intervention needed unless she is bothered by palpitations. Advised patient, verbalized understanding

## 2023-04-22 LAB — CYTOLOGY - PAP: Diagnosis: NEGATIVE

## 2023-05-06 ENCOUNTER — Other Ambulatory Visit (HOSPITAL_BASED_OUTPATIENT_CLINIC_OR_DEPARTMENT_OTHER): Payer: Self-pay | Admitting: Cardiovascular Disease

## 2023-05-06 NOTE — Telephone Encounter (Signed)
Rx request sent to pharmacy.  

## 2023-07-13 ENCOUNTER — Encounter (HOSPITAL_BASED_OUTPATIENT_CLINIC_OR_DEPARTMENT_OTHER): Payer: Self-pay | Admitting: Cardiovascular Disease

## 2023-07-13 ENCOUNTER — Ambulatory Visit (HOSPITAL_BASED_OUTPATIENT_CLINIC_OR_DEPARTMENT_OTHER): Payer: Medicare Other | Admitting: Cardiovascular Disease

## 2023-07-13 VITALS — BP 112/80 | HR 83 | Ht 62.0 in | Wt 146.2 lb

## 2023-07-13 DIAGNOSIS — E785 Hyperlipidemia, unspecified: Secondary | ICD-10-CM | POA: Diagnosis not present

## 2023-07-13 DIAGNOSIS — I2510 Atherosclerotic heart disease of native coronary artery without angina pectoris: Secondary | ICD-10-CM | POA: Diagnosis not present

## 2023-07-13 DIAGNOSIS — I10 Essential (primary) hypertension: Secondary | ICD-10-CM

## 2023-07-13 NOTE — Progress Notes (Signed)
Cardiology Office Note:  .    Date:  07/13/2023  ID:  Ozella Rocks, DOB [birth year], MRN [id number] PCP: [name], MD  [facility] Providers Cardiologist:  None     History of Present Illness: Marland Kitchen    AMEIAH SHEAR is a 77 y.o. female with a hx of hypertension, hyperlipidemia, anxiety, depression, diverticulosis, IBS, non-obstructive CAD, and hypothyroidism, here to follow up. She was first seen 05/2021 to reestablish care. She was previously a patient of Dr. [name], and last saw him in 2016. At that time she was doing well. She had a nuclear stress test in 2009 that was negative for ischemia, LVEF was 73%.   At her initial visit, she was doing well and encouraged to work on lifestyle modification. She had an abnormal stress test at [facility] 12/2020, however, coronary CTA revealed minimal non-obstructve CAD and a calcium score in the 30th percentile.  At her appointment 05/2022 she was doing well.  She had a couple episodes of atypical chest pain that were not thought to be ischemic.  She was encouraged to keep exercising.  Today, she reports that on 10/25 she is scheduled for bunion and hammertoe surgery to be performed by Dr. Lelon Mast. She has not been able to exercise as usual. After 30 minutes of bike riding/walking she is unable to tolerate the pain. She denies feeling limited by shortness of breath or chest pain. She has noticed that her pulse seems to elevate quickly with exercise which she attributes to deconditioning. Typically she drinks a lot of water. In 12/2022 she had presented to the ER with concerns for hypertension as high as 180s/90s. At the time she was feeling lightheaded and a little dizzy. Since then she denies any further episodes of spiking blood pressures. In the office today her blood pressure is 112/80. EKG performed today showed normal sinus rhythm at 83 bpm. Of note, she also reports having a nickel allergy. She denies any palpitations, chest pain,  shortness of breath, peripheral edema, lightheadedness, headaches, syncope, orthopnea, or PND.  ROS:  Please see the history of present illness. All other systems are reviewed and negative.  (+) right foot pain  Studies Reviewed: .        Risk Assessment/Calculations:             Physical Exam:    VS:  BP 112/80 (BP Location: Left Arm, Patient Position: Sitting, Cuff Size: Normal)   Pulse 83   Ht 5\' 2"  (1.575 m)   Wt 146 lb 3.2 oz (66.3 kg)   LMP 03/25/2009   BMI 26.74 kg/m  , BMI Body mass index is 26.74 kg/m. GENERAL:  Well appearing HEENT: Pupils equal round and reactive, fundi not visualized, oral mucosa unremarkable NECK:  No jugular venous distention, waveform within normal limits, carotid upstroke brisk and symmetric, no bruits, no thyromegaly LUNGS:  Clear to auscultation bilaterally HEART:  RRR.  PMI not displaced or sustained,S1 and S2 within normal limits, no S3, no S4, no clicks, no rubs, no murmurs ABD:  Flat, positive bowel sounds normal in frequency in pitch, no bruits, no rebound, no guarding, no midline pulsatile mass, no hepatomegaly, no splenomegaly EXT:  2 plus pulses throughout, no edema, no cyanosis no clubbing SKIN:  No rashes no nodules NEURO:  Cranial nerves II through XII grossly intact, motor grossly intact throughout PSYCH:  Cognitively intact, oriented to person place and time  Wt Readings from Last 3 Encounters:  07/13/23 146 lb  3.2 oz (66.3 kg)  04/19/23 150 lb 3.2 oz (68.1 kg)  06/14/22 140 lb 9.6 oz (63.8 kg)     ASSESSMENT AND PLAN: .    # Hypertension Stable blood pressure on current regimen of amlodipine, clonidine, and valsartan/hydrochlorothiazide. One episode of elevated blood pressure in March, attributed to anxiety. -Continue amlodipine, clonidine, and valsartan/HCTZ.  #Hyperlipidemia Cholesterol well controlled on rosuvastatin. -Check LP(a) to determine if more aggressive cholesterol management is needed.  # Prediabetes A1c  in prediabetic range (6.6-6.7). -Encourage increased physical activity post-surgery.   # Bunion and Hammer toe: Pain and difficulty with mobility due to foot issues.  Surgery planned with Dr. Tami Ribas. -She is at low cardiac risk.  She is able to complete >4 METS of activity without chest pain or shortness of breath.  No further ischemic evaluation needed.      Dispo:  FU with Gaynor Ferreras C. Duke Salvia, MD, [facility] in 1 year.  I,Mathew Stumpf,acting as a Neurosurgeon for Chilton Si, MD.,have documented all relevant documentation on the behalf of Chilton Si, MD,as directed by  Chilton Si, MD while in the presence of Chilton Si, MD.  I, Roshon Duell C. Duke Salvia, MD have reviewed all documentation for this visit.  The documentation of the exam, diagnosis, procedures, and orders on 07/13/2023 are all accurate and complete.   Signed, Chilton Si, MD

## 2023-07-13 NOTE — Patient Instructions (Signed)
Medication Instructions:   Your physician recommends that you continue on your current medications as directed. Please refer to the Current Medication list given to you today.  *If you need a refill on your cardiac medications before your next appointment, please call your pharmacy*   Lab Work:  PLEASE HAVE YOUR PRIMARY CARE PROVIDER CHECK A (LIPOPROTEIN A) ON YOU--WE HAVE PROVIDED YOU WITH THE SLIP TO GIVE TO YOUR PCP TO HAVE THIS DRAWN  If you have labs (blood work) drawn today and your tests are completely normal, you will receive your results only by: MyChart Message (if you have MyChart) OR A paper copy in the mail If you have any lab test that is abnormal or we need to change your treatment, we will call you to review the results.    Follow-Up: At [facility], you and your health needs are our priority.  As part of our continuing mission to provide you with exceptional heart care, we have created designated Provider Care Teams.  These Care Teams include your primary Cardiologist (physician) and Advanced Practice Providers (APPs -  Physician Assistants and Nurse Practitioners) who all work together to provide you with the care you need, when you need it.  We recommend signing up for the patient portal called "MyChart".  Sign up information is provided on this After Visit Summary.  MyChart is used to connect with patients for Virtual Visits (Telemedicine).  Patients are able to view lab/test results, encounter notes, upcoming appointments, etc.  Non-urgent messages can be sent to your provider as well.   To learn more about what you can do with MyChart, go to [email or web address].    Your next appointment:   1 year(s)  Provider:   Chilton Si, MD

## 2023-07-13 NOTE — Progress Notes (Deleted)
Cardiology Office Note:  .   Date:  07/13/2023  ID:  Kaylee Waters, DOB [birth year], MRN [id number] PCP: Kaylee Inch, MD  [facility] Providers Cardiologist:  None { Click to update primary MD,subspecialty MD or APP then REFRESH:1}   History of Present Illness: Kaylee Waters   [name] is a 77 yWaterso. female with a hx of hypertension, hyperlipidemia, anxiety, depression, diverticulosis, IBS, non-obstructive CAD, and hypothyroidism, here to follow up. She was first seen 05/2021 to reestablish care. She was previously a patient of Dr. [name], and last saw him in 2016. At that time she was doing well. She had a nuclear stress test in 2009 that was negative for ischemia, LVEF was 73%.   At her initial visit, she was doing well and encouraged to work on lifestyle modification. She had an abnormal stress test at [facility] 12/2020, however, coronary CTA revealed minimal non-obstructve CAD and a calcium score in the 30th percentile.  At her appointment 05/2022 she was doing well.  She had a couple episodes of atypical chest pain that were not thought to be ischemic.  She was encouraged to keep exercising.  ROS: ***  Studies Reviewed: .        *** Risk Assessment/Calculations:   {Does this patient have ATRIAL FIBRILLATION?:[phone]} No BP recorded.  {Refresh Note OR Click here to enter BP  :1}***       Physical Exam:   VS:  LMP 03/25/2009    Wt Readings from Last 3 Encounters:  04/19/23 150 lb 3Waters2 oz (68Waters1 kg)  06/14/22 140 lb 9Waters6 oz (63Waters8 kg)  02/26/22 137 lb 9Waters6 oz (62Waters4 kg)    GEN: Well nourished, well developed in no acute distress NECK: No JVD; No carotid bruits CARDIAC: ***RRR, no murmurs, rubs, gallops RESPIRATORY:  Clear to auscultation without rales, wheezing or rhonchi  ABDOMEN: Soft, non-tender, non-distended EXTREMITIES:  No edema; No deformity   ASSESSMENT AND PLAN: .   ***    {Are you ordering a CV Procedure (eWatersg. stress test, cath, DCCV, TEE, etc)?   Press  F2        :[id number]}  Dispo: ***  Signed, Chilton Si, MD

## 2023-08-01 ENCOUNTER — Telehealth: Payer: Self-pay | Admitting: Cardiovascular Disease

## 2023-08-01 ENCOUNTER — Other Ambulatory Visit (HOSPITAL_BASED_OUTPATIENT_CLINIC_OR_DEPARTMENT_OTHER): Payer: Self-pay | Admitting: Cardiovascular Disease

## 2023-08-01 MED ORDER — CLONIDINE HCL 0.1 MG PO TABS: 0.1000 mg | ORAL_TABLET | Freq: Two times a day (BID) | ORAL | 11 refills | Status: DC

## 2023-08-01 NOTE — Telephone Encounter (Signed)
Spoke with patient, she has been using the Clonidine patch for years now. She also had been using another patch for quite a while. She had to stop the other patch secondary to itching and now has developed itching with the Clonidine  She does change location weekly.  Thinks maybe she has developed sensitivity to the adhesive Would like to change to oral Clonidine  Will forward to Pharm D for review

## 2023-08-01 NOTE — Telephone Encounter (Signed)
Spoke to patient she went off of patch since Saturday. Her BP for last 2 days 100/60 and 115/72. Denies any dizziness, SOB or headache. To avoid hypotension  will restart clonidine at lower dose in immediate-released pill form - clonidine 0.1 mg twice daily. Patch after effect may continue for 24-48 hours.  Follow up in 2-3 days if needed we will up the clonidine tablet dose to 0.2 mg twice daily

## 2023-08-01 NOTE — Telephone Encounter (Signed)
Pt c/o medication issue:  1. Name of Medication:   cloNIDine (CATAPRES - DOSED IN MG/24 HR) 0.2 mg/24hr patch    2. How are you currently taking this medication (dosage and times per day)? Place 1 patch (0.2 mg total) onto the skin once a week.    3. Are you having a reaction (difficulty breathing--STAT)? No  4. What is your medication issue? Patient is calling because this patch is causing her skin to become very itchy. Patient would like to know if there is a pill form of this medication. Please advise.

## 2023-08-04 NOTE — Telephone Encounter (Signed)
Called patient, reports her BP on 0.1 mg twice daily clonidine from past 2 days -123/75, 132/80,112/74, 116/72. Advised to continue with 0.1 mg twice daily clonidine oral dose.   If BP start staying above goal <130/80 can increase clonidine to 0.2 mg twice daily.  Patient denies dizziness, palpitation, swelling, SOB, headache.

## 2023-08-23 ENCOUNTER — Other Ambulatory Visit (HOSPITAL_BASED_OUTPATIENT_CLINIC_OR_DEPARTMENT_OTHER): Payer: Self-pay | Admitting: Cardiovascular Disease

## 2023-08-23 DIAGNOSIS — E7800 Pure hypercholesterolemia, unspecified: Secondary | ICD-10-CM

## 2024-01-18 ENCOUNTER — Encounter (HOSPITAL_BASED_OUTPATIENT_CLINIC_OR_DEPARTMENT_OTHER): Payer: Self-pay | Admitting: *Deleted

## 2024-03-02 ENCOUNTER — Encounter (HOSPITAL_BASED_OUTPATIENT_CLINIC_OR_DEPARTMENT_OTHER): Payer: Self-pay | Admitting: Obstetrics & Gynecology

## 2024-03-02 ENCOUNTER — Ambulatory Visit (INDEPENDENT_AMBULATORY_CARE_PROVIDER_SITE_OTHER): Payer: Medicare Other | Admitting: Obstetrics & Gynecology

## 2024-03-02 VITALS — BP 130/70 | HR 80 | Ht 62.0 in | Wt 149.6 lb

## 2024-03-02 DIAGNOSIS — I10 Essential (primary) hypertension: Secondary | ICD-10-CM | POA: Diagnosis not present

## 2024-03-02 DIAGNOSIS — E039 Hypothyroidism, unspecified: Secondary | ICD-10-CM

## 2024-03-02 DIAGNOSIS — Z01419 Encounter for gynecological examination (general) (routine) without abnormal findings: Secondary | ICD-10-CM | POA: Diagnosis not present

## 2024-03-02 DIAGNOSIS — I2510 Atherosclerotic heart disease of native coronary artery without angina pectoris: Secondary | ICD-10-CM | POA: Diagnosis not present

## 2024-03-02 DIAGNOSIS — Z780 Asymptomatic menopausal state: Secondary | ICD-10-CM

## 2024-03-02 NOTE — Progress Notes (Unsigned)
   Breast and Pelvic Patient name: Kaylee Waters MRN [id number]  Date of birth: 01-Jun-1946 Chief Complaint:   Breast and Pelvic Exam  History of Present Illness:   Kaylee Waters is a 78 y.o. [phone] Caucasian female being seen today for a routine annual exam.   Patient's last menstrual period was 03/25/2009.   Last pap  04/19/2023. Results were: neg.  Last mammogram: 01/13/2024 . Results were: normal. Family h/o breast cancer: no Last colonoscopy: 2015 . Results were: normal. She doesn't think she wants to do another one.      03/02/2024    9:17 AM 02/26/2022   11:05 AM  Depression screen PHQ 2/9  Decreased Interest 0 0  Down, Depressed, Hopeless 0 0  PHQ - 2 Score 0 0    Review of Systems:   Pertinent items are noted in HPI  Denies any bladder or bowel changes.  She has stable urinary urgency.   Pertinent History Reviewed:  Reviewed past medical,surgical, social and family history.  Reviewed problem list, medications and allergies. Physical Assessment:   Vitals:   03/02/24 0914  BP: 130/70  Pulse: 80  Weight: 149 lb 9.6 oz (67.9 kg)  Height: 5\' 2"  (1.575 m)  Body mass index is 27.36 kg/m.        Physical Examination:   General appearance - well appearing, and in no distress  Mental status - alert, oriented to person, place, and time  Psych:  She has a normal mood and affect  Skin - warm and dry, normal color, no suspicious lesions noted  Chest - effort normal, all lung fields clear to auscultation bilaterally  Heart - normal rate and regular rhythm  Neck:  midline trachea, no thyromegaly or nodules  Breasts - breasts appear normal, no suspicious masses, no skin or nipple changes or  axillary nodes  Abdomen - soft, nontender, nondistended, no masses or organomegaly  Pelvic - VULVA: normal appearing vulva with no masses, tenderness or lesions  VAGINA: normal appearing vagina with normal color and discharge, no lesions  CERVIX: normal appearing cervix without  discharge or lesions, no CMT  Thin prep pap is not indicated this year  UTERUS: uterus is felt to be normal size, shape, consistency and nontender   ADNEXA: No adnexal masses or tenderness noted.  Rectal - normal rectal, good sphincter tone, no masses felt.  Extremities:  No swelling or varicosities noted  Chaperone present for exam  Assessment & Plan:  1. Encntr for gyn exam (general) (routine) w/o abn findings (Primary) ***  2. Postmenopausal ***  3. Acquired hypothyroidism ***  4. CAD in native artery ***  5. Primary hypertension ***   Lillian Rein, MD 03/02/2024 10:00 AM

## 2024-07-25 ENCOUNTER — Other Ambulatory Visit (HOSPITAL_BASED_OUTPATIENT_CLINIC_OR_DEPARTMENT_OTHER): Payer: Self-pay | Admitting: Cardiovascular Disease

## 2024-08-16 ENCOUNTER — Other Ambulatory Visit (HOSPITAL_BASED_OUTPATIENT_CLINIC_OR_DEPARTMENT_OTHER): Payer: Self-pay | Admitting: Cardiovascular Disease

## 2024-08-16 DIAGNOSIS — E7800 Pure hypercholesterolemia, unspecified: Secondary | ICD-10-CM

## 2024-09-27 ENCOUNTER — Ambulatory Visit (INDEPENDENT_AMBULATORY_CARE_PROVIDER_SITE_OTHER): Admitting: Cardiovascular Disease

## 2024-09-27 ENCOUNTER — Encounter (HOSPITAL_BASED_OUTPATIENT_CLINIC_OR_DEPARTMENT_OTHER): Payer: Self-pay | Admitting: Cardiovascular Disease

## 2024-09-27 VITALS — BP 124/82 | HR 91 | Ht 62.0 in | Wt 146.0 lb

## 2024-09-27 DIAGNOSIS — E785 Hyperlipidemia, unspecified: Secondary | ICD-10-CM | POA: Diagnosis not present

## 2024-09-27 DIAGNOSIS — E039 Hypothyroidism, unspecified: Secondary | ICD-10-CM | POA: Diagnosis not present

## 2024-09-27 DIAGNOSIS — I10 Essential (primary) hypertension: Secondary | ICD-10-CM

## 2024-09-27 DIAGNOSIS — I2510 Atherosclerotic heart disease of native coronary artery without angina pectoris: Secondary | ICD-10-CM | POA: Diagnosis not present

## 2024-09-27 MED ORDER — AMLODIPINE BESYLATE 10 MG PO TABS: 10.0000 mg | ORAL_TABLET | Freq: Every day | ORAL | 3 refills | Status: AC

## 2024-09-27 MED ORDER — VALSARTAN 160 MG PO TABS: 160.0000 mg | ORAL_TABLET | Freq: Every day | ORAL | 3 refills | Status: AC

## 2024-09-27 MED ORDER — HYDROCHLOROTHIAZIDE 12.5 MG PO TABS: 12.5000 mg | ORAL_TABLET | Freq: Every day | ORAL | 3 refills | Status: AC

## 2024-09-27 NOTE — Progress Notes (Signed)
 Cardiology Office Note:  .   Date:  09/27/2024  ID:  [name], DOB Feb 02, 1946, MRN [id number] PCP: Jacques Camie Franchot DEVONNA  [facility] Providers Cardiologist:  None    History of Present Illness: Kaylee Waters   Kaylee Waters is a 78 y.o. female with a hx of non-obstructive CAD, hypertension, hyperlipidemia, anxiety, depression, diverticulosis, IBS, non-obstructive CAD, and hypothyroidism, here to follow up. She was first seen 05/2021 to reestablish care. She was previously a patient of Dr. Dominick, and last saw him in 2016. At that time she was doing well. She had a nuclear stress test in 2009 that was negative for ischemia, LVEF was 73%.   At her initial visit, she was doing well and encouraged to work on lifestyle modification. She had an abnormal stress test at [facility] 12/2020, however, coronary CTA revealed minimal non-obstructve CAD and a calcium  score in the 30th percentile.  At her appointment 05/2022 she was doing well.  She had a couple episodes of atypical chest pain that were not thought to be ischemic.  She was encouraged to keep exercising.   12/2022 she had presented to the ER with concerns for hypertension as high as 180s/90s. At the time she was feeling lightheaded and a little dizzy. This episde was attributed to anxiety.  Since then she denies any further episodes of spiking blood pressures. In the office 06/2023 her blood pressure was 112/80. EKG performed today showed normal sinus rhythm at 83 bpm. Of note, she also reports having a nickel allergy.   Discussed the use of AI scribe software for clinical note transcription with the patient, who gave verbal consent to proceed.  History of Present Illness [name] has been experiencing episodes of chest pain over the past three weeks, primarily during activities such as cleaning or vacuuming. The pain is located in the middle of her chest and is sometimes accompanied by shortness of breath. The episodes are brief,  lasting about one to two minutes, and resolve with rest. No swelling in her legs or feet has been noted.  She monitors her vital signs daily and notes that her pulse can increase to 90-100 bpm quickly, especially during physical activities like vacuuming.  Her current medications include amlodipine  taken at bedtime, valsartan  hydrochlorothiazide  taken in the morning, and rosuvastatin  taken at bedtime. She has a history of a calcium  score CT scan three years ago, which showed less than 25% blockage in the LAD.  She has a history of diabetes with a recent A1c of 6.7, slightly up from 6.5 previously. She struggles with appetite and cooking, often relying on pre-prepared meals from local markets.  She has been experiencing some hip pain when climbing stairs. She is active in daily activities such as cleaning and shopping but does not engage in regular exercise. She has been traveling recently to visit family.  ROS:  As per HPI  Studies Reviewed: Kaylee Waters   EKG Interpretation Date/Time:  Thursday September 27 2024 08:33:18 EST Ventricular Rate:  91 PR Interval:  146 QRS Duration:  72 QT Interval:  368 QTC Calculation: 452 R Axis:   -64  Text Interpretation: Normal sinus rhythm Left anterior fasicular block When compared with ECG of 13-Jul-2023 11:35, No significant change was found Confirmed by Raford Riggs ([id number]) on 09/27/2024 9:03:21 AM     Risk Assessment/Calculations:             Physical Exam:   VS:  BP 124/82 (BP Location: Left Arm, Patient  Position: Sitting, Cuff Size: Normal)   Pulse 91   Ht 5' 2 (1.575 m)   Wt 146 lb (66.2 kg)   LMP 03/25/2009   SpO2 96%   BMI 26.70 kg/m  , BMI Body mass index is 26.7 kg/m. GENERAL:  Well appearing HEENT: Pupils equal round and reactive, fundi not visualized, oral mucosa unremarkable NECK:  No jugular venous distention, waveform within normal limits, carotid upstroke brisk and symmetric, no bruits, no thyromegaly LUNGS:  Clear to  auscultation bilaterally HEART:  RRR.  PMI not displaced or sustained,S1 and S2 within normal limits, no S3, no S4, no clicks, no rubs, no murmurs ABD:  Flat, positive bowel sounds normal in frequency in pitch, no bruits, no rebound, no guarding, no midline pulsatile mass, no hepatomegaly, no splenomegaly EXT:  2 plus pulses throughout, no edema, no cyanosis no clubbing SKIN:  No rashes no nodules NEURO:  Cranial nerves II through XII grossly intact, motor grossly intact throughout PSYCH:  Cognitively intact, oriented to person place and time   ASSESSMENT AND PLAN: .    Assessment & Plan # Coronary artery disease: Intermittent chest pain with activity unlikely due to obstructive coronary disease given minimal LAD blockage. Deconditioning may contribute to symptoms. - Initiate walking program twice a week with neighbors. - Monitor for chest discomfort during walking, especially on inclines. - Send MyChart message if symptoms worsen or new symptoms develop. - Consider cardiac CT with contrast if symptoms suggestive of obstructive coronary disease occur.  # Primary hypertension Blood pressure well-controlled with amlodipine  and valsartan  hydrochlorothiazide . Prefers separate medications. - Continue current antihypertensive regimen with amlodipine  and valsartan  hydrochlorothiazide . - Prescribed amlodipine  for 90 days. - Separated valsartan  and hydrochlorothiazide  prescriptions.  # Dyslipidemia Lipid levels within normal limits on rosuvastatin . - Continue rosuvastatin  at bedtime.          Dispo: f/u 6 months  Signed, Annabella Scarce, MD

## 2024-09-27 NOTE — Patient Instructions (Addendum)
 Medication Instructions:  STOP VALSARTAN -HCT   START VALSARTAN  160 MG DAILY   START hydrochlorothiazide  12.5 MG DAILY   *If you need a refill on your cardiac medications before your next appointment, please call your pharmacy*  Lab Work: NONE  Testing/Procedures: NONE  Follow-Up: At [facility], you and your health needs are our priority.  As part of our continuing mission to provide you with exceptional heart care, our providers are all part of one team.  This team includes your primary Cardiologist (physician) and Advanced Practice Providers or APPs (Physician Assistants and Nurse Practitioners) who all work together to provide you with the care you need, when you need it.  Your next appointment:   6 month(s)  Provider:   Annabella Scarce, MD, Rosaline Bane, NP, or Reche Finder, NP    We recommend signing up for the patient portal called MyChart.  Sign up information is provided on this After Visit Summary.  MyChart is used to connect with patients for Virtual Visits (Telemedicine).  Patients are able to view lab/test results, encounter notes, upcoming appointments, etc.  Non-urgent messages can be sent to your provider as well.   To learn more about what you can do with MyChart, go to [email or web address].   SPECIAL INSTRUCTIONS: INCREASE YOUR EXERCISE BY WALKING AT LEAST 2 TIMES A WEEK

## 2024-11-08 NOTE — Progress Notes (Unsigned)
" ° °  GYNECOLOGY  VISIT  CC:  vaginal irritation   HPI: 79 y.o. [id number] Widowed White or Caucasian female here for vaginal irritation. She reports that she had a UTI in December.  The culture was positive for E coli, proteus and enterococus.   Results reviewed personally.  She was treated with Keflex  500mg  bid x 7 days.  Then she was treated with ciprofloxin 250mg  bid x 10 days.  She just finished the antibiotics today.   She reports that since then, she has been having some burning/vaginal irritation. She reports that she has increased irritation when she showers as well and is unsure if it is the products that she is using.  Denies vaginal bleeding.  Not having any dysuria but having some continued urinary urgency.  Patient's last menstrual period was 03/25/2009.  Past Medical History:  Diagnosis Date   Anxiety    CAD in native artery 06/15/2021   Cataract of both eyes    Closed right tibial fracture 02/2017   Depression    Diverticulosis    Dry eye    Headache    High cholesterol    controlled   HTN (hypertension)    controlled   Hyperglycemia    IBS (irritable bowel syndrome)    w/ castipatia   IBS (irritable bowel syndrome)    Mitral valve prolapse    Thyroid  disease    hypothyroidism   Vocal cord paresis     MEDS:  Reviewed in EPIC  ALLERGIES: Statins, Codeine, Jardiance [empagliflozin], Lipitor [atorvastatin calcium ], Metformin and related, Mevacor [lovastatin], Morphine, Nickel, and Zocor [simvastatin]  SH:  widowed, non smoker  Review of Systems  Constitutional: Negative.   Genitourinary:  Positive for urgency.    PHYSICAL EXAMINATION:    BP 119/73 (BP Location: Left Arm, Patient Position: Sitting, Cuff Size: Normal)   Pulse 97   Wt 148 lb (67.1 kg)   LMP 03/25/2009   SpO2 97%   BMI 27.07 kg/m     General appearance: alert, cooperative and appears stated age Lymph:  no inguinal LAD noted  Pelvic: External genitalia:  mild erythema present               Urethra:  normal appearing urethra with no masses, tenderness or lesions              Bartholins and Skenes: normal                 Chaperone was present for exam.  Assessment/Plan: 1. History of UTI (Primary) - given continued mild symptoms and the presence of three bacteria in her urine, will proceed with repeating the urine culture - Urine Culture - POCT Urinalysis Dipstick  2. Vulvar irritation - will r/o yeast and BV - if negative, will treat with topical steroid ointment - Cervicovaginal ancillary only( [place])  3. Urinary urgency    "

## 2024-11-09 ENCOUNTER — Encounter (HOSPITAL_BASED_OUTPATIENT_CLINIC_OR_DEPARTMENT_OTHER): Payer: Self-pay | Admitting: Obstetrics & Gynecology

## 2024-11-09 ENCOUNTER — Ambulatory Visit (INDEPENDENT_AMBULATORY_CARE_PROVIDER_SITE_OTHER): Payer: Self-pay | Admitting: Obstetrics & Gynecology

## 2024-11-09 ENCOUNTER — Other Ambulatory Visit (HOSPITAL_COMMUNITY)
Admission: RE | Admit: 2024-11-09 | Discharge: 2024-11-09 | Disposition: A | Source: Ambulatory Visit | Attending: Obstetrics & Gynecology | Admitting: Obstetrics & Gynecology

## 2024-11-09 VITALS — BP 119/73 | HR 97 | Wt 148.0 lb

## 2024-11-09 DIAGNOSIS — N390 Urinary tract infection, site not specified: Secondary | ICD-10-CM | POA: Diagnosis not present

## 2024-11-09 DIAGNOSIS — R3915 Urgency of urination: Secondary | ICD-10-CM

## 2024-11-09 DIAGNOSIS — N9089 Other specified noninflammatory disorders of vulva and perineum: Secondary | ICD-10-CM | POA: Diagnosis not present

## 2024-11-09 DIAGNOSIS — Z87440 Personal history of urinary (tract) infections: Secondary | ICD-10-CM

## 2024-11-09 LAB — POCT URINALYSIS DIPSTICK
Bilirubin, UA: NEGATIVE
Blood, UA: NEGATIVE
Glucose, UA: NEGATIVE
Ketones, UA: NEGATIVE
Leukocytes, UA: NEGATIVE
Nitrite, UA: NEGATIVE
Protein, UA: NEGATIVE
Spec Grav, UA: 1.015
Urobilinogen, UA: 0.2 U/dL
pH, UA: 5.5

## 2024-11-11 LAB — URINE CULTURE

## 2024-11-12 ENCOUNTER — Ambulatory Visit (HOSPITAL_BASED_OUTPATIENT_CLINIC_OR_DEPARTMENT_OTHER): Payer: Self-pay | Admitting: Obstetrics & Gynecology

## 2024-11-12 LAB — CERVICOVAGINAL ANCILLARY ONLY
Bacterial Vaginitis (gardnerella): NEGATIVE
Candida Glabrata: NEGATIVE
Candida Vaginitis: POSITIVE — AB
Comment: NEGATIVE
Comment: NEGATIVE
Comment: NEGATIVE

## 2024-11-13 MED ORDER — FLUCONAZOLE 150 MG PO TABS: ORAL_TABLET | ORAL | 0 refills | Status: AC

## 2024-11-15 ENCOUNTER — Other Ambulatory Visit (HOSPITAL_BASED_OUTPATIENT_CLINIC_OR_DEPARTMENT_OTHER): Payer: Self-pay | Admitting: Cardiovascular Disease

## 2024-11-15 DIAGNOSIS — E7800 Pure hypercholesterolemia, unspecified: Secondary | ICD-10-CM

## 2025-03-07 ENCOUNTER — Ambulatory Visit (HOSPITAL_BASED_OUTPATIENT_CLINIC_OR_DEPARTMENT_OTHER): Admitting: Obstetrics & Gynecology
